# Patient Record
Sex: Female | Born: 1965 | Race: White | Hispanic: No | Marital: Married | Smoking: Former smoker
Health system: Southern US, Community
[De-identification: ages and names within clinical notes are randomized; demographics above are authoritative.]

## PROBLEM LIST (undated history)

## (undated) DIAGNOSIS — I83811 Varicose veins of right lower extremities with pain: Secondary | ICD-10-CM

## (undated) DIAGNOSIS — F319 Bipolar disorder, unspecified: Secondary | ICD-10-CM

## (undated) DIAGNOSIS — R739 Hyperglycemia, unspecified: Secondary | ICD-10-CM

## (undated) DIAGNOSIS — E669 Obesity, unspecified: Secondary | ICD-10-CM

## (undated) DIAGNOSIS — K219 Gastro-esophageal reflux disease without esophagitis: Secondary | ICD-10-CM

## (undated) DIAGNOSIS — J449 Chronic obstructive pulmonary disease, unspecified: Secondary | ICD-10-CM

## (undated) DIAGNOSIS — M1990 Unspecified osteoarthritis, unspecified site: Secondary | ICD-10-CM

## (undated) DIAGNOSIS — M419 Scoliosis, unspecified: Secondary | ICD-10-CM

## (undated) DIAGNOSIS — J45909 Unspecified asthma, uncomplicated: Secondary | ICD-10-CM

## (undated) DIAGNOSIS — M797 Fibromyalgia: Secondary | ICD-10-CM

## (undated) DIAGNOSIS — B1920 Unspecified viral hepatitis C without hepatic coma: Secondary | ICD-10-CM

## (undated) DIAGNOSIS — I10 Essential (primary) hypertension: Secondary | ICD-10-CM

## (undated) DIAGNOSIS — F419 Anxiety disorder, unspecified: Secondary | ICD-10-CM

## (undated) DIAGNOSIS — K601 Chronic anal fissure: Secondary | ICD-10-CM

## (undated) HISTORY — DX: Unspecified asthma, uncomplicated: J45.909

## (undated) HISTORY — DX: Unspecified osteoarthritis, unspecified site: M19.90

## (undated) HISTORY — DX: Obesity, unspecified: E66.9

## (undated) HISTORY — DX: Hyperglycemia, unspecified: R73.9

## (undated) HISTORY — DX: Chronic obstructive pulmonary disease, unspecified: J44.9

## (undated) HISTORY — PX: CATARACT EXTRACTION: SUR2

## (undated) HISTORY — PX: CORNEA LACERATION REPAIR: SHX355

## (undated) HISTORY — DX: Anxiety disorder, unspecified: F41.9

## (undated) HISTORY — DX: Unspecified viral hepatitis C without hepatic coma: B19.20

## (undated) HISTORY — DX: Gastro-esophageal reflux disease without esophagitis: K21.9

## (undated) HISTORY — DX: Bipolar disorder, unspecified: F31.9

## (undated) HISTORY — DX: Chronic anal fissure: K60.1

## (undated) HISTORY — PX: OTHER SURGICAL HISTORY: SHX169

## (undated) HISTORY — DX: Fibromyalgia: M79.7

## (undated) HISTORY — DX: Varicose veins of right lower extremity with pain: I83.811

## (undated) HISTORY — DX: Essential (primary) hypertension: I10

## (undated) HISTORY — DX: Scoliosis, unspecified: M41.9

---

## 1986-08-28 HISTORY — PX: TUBAL LIGATION: SHX77

## 2007-08-29 HISTORY — PX: ABDOMINAL HYSTERECTOMY: SHX81

## 2009-05-24 LAB — HM COLONOSCOPY

## 2010-08-28 HISTORY — PX: CHOLECYSTECTOMY: SHX55

## 2019-06-04 ENCOUNTER — Ambulatory Visit: Payer: Self-pay | Admitting: Family Medicine

## 2019-06-13 ENCOUNTER — Other Ambulatory Visit: Payer: Self-pay

## 2019-06-13 ENCOUNTER — Ambulatory Visit (INDEPENDENT_AMBULATORY_CARE_PROVIDER_SITE_OTHER): Payer: Medicare Other | Admitting: Family Medicine

## 2019-06-13 ENCOUNTER — Encounter: Payer: Self-pay | Admitting: Family Medicine

## 2019-06-13 VITALS — BP 126/82 | HR 70 | Temp 97.9°F | Resp 14 | Ht 65.5 in | Wt 210.0 lb

## 2019-06-13 DIAGNOSIS — J449 Chronic obstructive pulmonary disease, unspecified: Secondary | ICD-10-CM

## 2019-06-13 DIAGNOSIS — I10 Essential (primary) hypertension: Secondary | ICD-10-CM | POA: Diagnosis not present

## 2019-06-13 DIAGNOSIS — Z1211 Encounter for screening for malignant neoplasm of colon: Secondary | ICD-10-CM

## 2019-06-13 DIAGNOSIS — F419 Anxiety disorder, unspecified: Secondary | ICD-10-CM

## 2019-06-13 DIAGNOSIS — Z5181 Encounter for therapeutic drug level monitoring: Secondary | ICD-10-CM

## 2019-06-13 DIAGNOSIS — I83811 Varicose veins of right lower extremities with pain: Secondary | ICD-10-CM

## 2019-06-13 DIAGNOSIS — E785 Hyperlipidemia, unspecified: Secondary | ICD-10-CM

## 2019-06-13 DIAGNOSIS — M419 Scoliosis, unspecified: Secondary | ICD-10-CM

## 2019-06-13 DIAGNOSIS — R7303 Prediabetes: Secondary | ICD-10-CM | POA: Diagnosis not present

## 2019-06-13 DIAGNOSIS — K219 Gastro-esophageal reflux disease without esophagitis: Secondary | ICD-10-CM | POA: Insufficient documentation

## 2019-06-13 DIAGNOSIS — Z23 Encounter for immunization: Secondary | ICD-10-CM | POA: Diagnosis not present

## 2019-06-13 DIAGNOSIS — M797 Fibromyalgia: Secondary | ICD-10-CM

## 2019-06-13 DIAGNOSIS — J45909 Unspecified asthma, uncomplicated: Secondary | ICD-10-CM

## 2019-06-13 DIAGNOSIS — R600 Localized edema: Secondary | ICD-10-CM

## 2019-06-13 MED ORDER — DULOXETINE HCL 30 MG PO CPEP: 30.0000 mg | ORAL_CAPSULE | Freq: Every day | ORAL | 1 refills | Status: DC

## 2019-06-13 MED ORDER — FENOFIBRATE MICRONIZED 134 MG PO CAPS: 134.0000 mg | ORAL_CAPSULE | Freq: Every day | ORAL | 1 refills | Status: DC

## 2019-06-13 MED ORDER — BUSPIRONE HCL 15 MG PO TABS: 15.0000 mg | ORAL_TABLET | Freq: Three times a day (TID) | ORAL | 1 refills | Status: DC

## 2019-06-13 MED ORDER — NEBULIZER/TUBING/MOUTHPIECE KIT: PACK | 0 refills | Status: DC

## 2019-06-13 MED ORDER — DICLOFENAC SODIUM 1 % TD GEL: 4.0000 g | Freq: Two times a day (BID) | TRANSDERMAL | 4 refills | Status: DC

## 2019-06-13 MED ORDER — ONDANSETRON HCL 8 MG PO TABS: 8.0000 mg | ORAL_TABLET | Freq: Three times a day (TID) | ORAL | 1 refills | Status: DC | PRN

## 2019-06-13 MED ORDER — ALBUTEROL SULFATE HFA 108 (90 BASE) MCG/ACT IN AERS: 2.0000 | INHALATION_SPRAY | RESPIRATORY_TRACT | 3 refills | Status: DC | PRN

## 2019-06-13 MED ORDER — GABAPENTIN 300 MG PO CAPS: ORAL_CAPSULE | ORAL | 1 refills | Status: DC

## 2019-06-13 MED ORDER — PANTOPRAZOLE SODIUM 40 MG PO TBEC: 40.0000 mg | DELAYED_RELEASE_TABLET | Freq: Every day | ORAL | 1 refills | Status: DC

## 2019-06-13 MED ORDER — FLUTICASONE-SALMETEROL 250-50 MCG/DOSE IN AEPB: 1.0000 | INHALATION_SPRAY | Freq: Two times a day (BID) | RESPIRATORY_TRACT | 5 refills | Status: DC

## 2019-06-13 MED ORDER — IPRATROPIUM-ALBUTEROL 0.5-2.5 (3) MG/3ML IN SOLN: 3.0000 mL | Freq: Four times a day (QID) | RESPIRATORY_TRACT | 1 refills | Status: AC | PRN

## 2019-06-13 MED ORDER — LISINOPRIL 20 MG PO TABS: 20.0000 mg | ORAL_TABLET | Freq: Every day | ORAL | 3 refills | Status: DC
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
## 2019-06-13 NOTE — Patient Instructions (Signed)
Stop omeprazole and start protonix in the morning on an empty stomach - see GERD info below  I increased the dose on the inhaler, I will be working on getting you a nebulizer machine.  Would like for you to come back sooner to check this and see if we need to do some tests on your breathing, or adjust meds more.   Food Choices for Gastroesophageal Reflux Disease, Adult When you have gastroesophageal reflux disease (GERD), the foods you eat and your eating habits are very important. Choosing the right foods can help ease your discomfort. Think about working with a nutrition specialist (dietitian) to help you make good choices. What are tips for following this plan?  Meals  Choose healthy foods that are low in fat, such as fruits, vegetables, whole grains, low-fat dairy products, and lean meat, fish, and poultry.  Eat small meals often instead of 3 large meals a day. Eat your meals slowly, and in a place where you are relaxed. Avoid bending over or lying down until 2-3 hours after eating.  Avoid eating meals 2-3 hours before bed.  Avoid drinking a lot of liquid with meals.  Cook foods using methods other than frying. Bake, grill, or broil food instead.  Avoid or limit: ? Chocolate. ? Peppermint or spearmint. ? Alcohol. ? Pepper. ? Black and decaffeinated coffee. ? Black and decaffeinated tea. ? Bubbly (carbonated) soft drinks. ? Caffeinated energy drinks and soft drinks.  Limit high-fat foods such as: ? Fatty meat or fried foods. ? Whole milk, cream, butter, or ice cream. ? Nuts and nut butters. ? Pastries, donuts, and sweets made with butter or shortening.  Avoid foods that cause symptoms. These foods may be different for everyone. Common foods that cause symptoms include: ? Tomatoes. ? Oranges, lemons, and limes. ? Peppers. ? Spicy food. ? Onions and garlic. ? Vinegar. Lifestyle  Maintain a healthy weight. Ask your doctor what weight is healthy for you. If you need to  lose weight, work with your doctor to do so safely.  Exercise for at least 30 minutes for 5 or more days each week, or as told by your doctor.  Wear loose-fitting clothes.  Do not smoke. If you need help quitting, ask your doctor.  Sleep with the head of your bed higher than your feet. Use a wedge under the mattress or blocks under the bed frame to raise the head of the bed. Summary  When you have gastroesophageal reflux disease (GERD), food and lifestyle choices are very important in easing your symptoms.  Eat small meals often instead of 3 large meals a day. Eat your meals slowly, and in a place where you are relaxed.  Limit high-fat foods such as fatty meat or fried foods.  Avoid bending over or lying down until 2-3 hours after eating.  Avoid peppermint and spearmint, caffeine, alcohol, and chocolate. This information is not intended to replace advice given to you by your health care provider. Make sure you discuss any questions you have with your health care provider. Document Released: 02/13/2012 Document Revised: 12/05/2018 Document Reviewed: 09/19/2016 Elsevier Patient Education  2020 [employer].

## 2019-06-14 LAB — HEMOGLOBIN A1C
Hgb A1c MFr Bld: 6.2 % of total Hgb — ABNORMAL HIGH (ref <5.7)
Mean Plasma Glucose: 131 (calc)
eAG (mmol/L): 7.3 (calc)

## 2019-06-14 LAB — CBC WITH DIFFERENTIAL/PLATELET
Absolute Monocytes: 961 cells/uL — ABNORMAL HIGH (ref 200–950)
Basophils Absolute: 65 cells/uL (ref 0–200)
Basophils Relative: 0.6 %
Eosinophils Absolute: 194 cells/uL (ref 15–500)
Eosinophils Relative: 1.8 %
HCT: 42.2 % (ref 35.0–45.0)
Hemoglobin: 14.4 g/dL (ref 11.7–15.5)
Lymphs Abs: 3305 cells/uL (ref 850–3900)
MCH: 30.4 pg (ref 27.0–33.0)
MCHC: 34.1 g/dL (ref 32.0–36.0)
MCV: 89 fL (ref 80.0–100.0)
MPV: 12.1 fL (ref 7.5–12.5)
Monocytes Relative: 8.9 %
Neutro Abs: 6275 cells/uL (ref 1500–7800)
Neutrophils Relative %: 58.1 %
Platelets: 319 10*3/uL (ref 140–400)
RBC: 4.74 10*6/uL (ref 3.80–5.10)
RDW: 12.6 % (ref 11.0–15.0)
Total Lymphocyte: 30.6 %
WBC: 10.8 10*3/uL (ref 3.8–10.8)

## 2019-06-14 LAB — COMPLETE METABOLIC PANEL WITH GFR
AG Ratio: 1.8 (calc) (ref 1.0–2.5)
ALT: 23 U/L (ref 6–29)
AST: 19 U/L (ref 10–35)
Albumin: 4.7 g/dL (ref 3.6–5.1)
Alkaline phosphatase (APISO): 76 U/L (ref 37–153)
BUN: 20 mg/dL (ref 7–25)
CO2: 25 mmol/L (ref 20–32)
Calcium: 10.2 mg/dL (ref 8.6–10.4)
Chloride: 105 mmol/L (ref 98–110)
Creat: 0.81 mg/dL (ref 0.50–1.05)
GFR, Est African American: 95 mL/min/{1.73_m2} (ref >=60)
GFR, Est Non African American: 82 mL/min/{1.73_m2} (ref >=60)
Globulin: 2.6 g/dL (calc) (ref 1.9–3.7)
Glucose, Bld: 107 mg/dL — ABNORMAL HIGH (ref 65–99)
Potassium: 4.7 mmol/L (ref 3.5–5.3)
Sodium: 140 mmol/L (ref 135–146)
Total Bilirubin: 0.3 mg/dL (ref 0.2–1.2)
Total Protein: 7.3 g/dL (ref 6.1–8.1)

## 2019-06-14 LAB — LIPID PANEL
Cholesterol: 180 mg/dL (ref <200)
HDL: 39 mg/dL — ABNORMAL LOW (ref >=50)
LDL Cholesterol (Calc): 112 mg/dL (calc) — ABNORMAL HIGH
Non-HDL Cholesterol (Calc): 141 mg/dL (calc) — ABNORMAL HIGH (ref <130)
Total CHOL/HDL Ratio: 4.6 (calc) (ref <5.0)
Triglycerides: 177 mg/dL — ABNORMAL HIGH (ref <150)

## 2019-06-23 ENCOUNTER — Encounter: Payer: Self-pay | Admitting: Family Medicine

## 2019-06-26 ENCOUNTER — Encounter: Payer: Self-pay | Admitting: Family Medicine

## 2019-06-27 ENCOUNTER — Other Ambulatory Visit: Payer: Self-pay

## 2019-06-27 MED ORDER — BACLOFEN 10 MG PO TABS: 10.0000 mg | ORAL_TABLET | Freq: Three times a day (TID) | ORAL | 1 refills | Status: DC

## 2019-07-07 ENCOUNTER — Encounter: Payer: Self-pay | Admitting: Family Medicine

## 2019-07-09 ENCOUNTER — Encounter: Payer: Self-pay | Admitting: Family Medicine

## 2019-07-11 ENCOUNTER — Other Ambulatory Visit: Payer: Self-pay | Admitting: Family Medicine

## 2019-07-11 ENCOUNTER — Encounter: Payer: Self-pay | Admitting: Family Medicine

## 2019-07-11 MED ORDER — TRAMADOL HCL 50 MG PO TABS: 50.0000 mg | ORAL_TABLET | Freq: Three times a day (TID) | ORAL | 0 refills | Status: DC | PRN

## 2019-07-11 NOTE — Progress Notes (Signed)
meds refilled through weekend, controlled substance database reviewed, patient is on a low-dose of tramadol 50 mg 3 times a day her pain medicine prescriptions at the pharmacy are fairly consistent she has transferred care here and we did not discuss this at length that her initial appointment.  She will be coming in next week to further discuss pain management and sign a controlled substance contract.  Meds ordered this encounter  Medications  . traMADol (ULTRAM) 50 MG tablet    Sig: Take 1 tablet (50 mg total) by mouth every 8 (eight) hours as needed for up to 5 days for severe pain.    Dispense:  15 tablet    Refill:  0    For chronic pain    Order Specific Question:   Supervising Provider    Answer:   Steele Sizer [[id number]]   Delsa Grana, PA-C

## 2019-07-14 ENCOUNTER — Ambulatory Visit (INDEPENDENT_AMBULATORY_CARE_PROVIDER_SITE_OTHER): Payer: Medicare Other | Admitting: Family Medicine

## 2019-07-14 ENCOUNTER — Other Ambulatory Visit: Payer: Self-pay

## 2019-07-14 ENCOUNTER — Encounter: Payer: Self-pay | Admitting: Family Medicine

## 2019-07-14 VITALS — BP 146/84 | HR 94 | Temp 97.8°F | Resp 14 | Ht 66.0 in | Wt 215.3 lb

## 2019-07-14 DIAGNOSIS — J449 Chronic obstructive pulmonary disease, unspecified: Secondary | ICD-10-CM

## 2019-07-14 DIAGNOSIS — M419 Scoliosis, unspecified: Secondary | ICD-10-CM | POA: Diagnosis not present

## 2019-07-14 DIAGNOSIS — M5441 Lumbago with sciatica, right side: Secondary | ICD-10-CM

## 2019-07-14 DIAGNOSIS — Z0289 Encounter for other administrative examinations: Secondary | ICD-10-CM | POA: Diagnosis not present

## 2019-07-14 DIAGNOSIS — R7303 Prediabetes: Secondary | ICD-10-CM

## 2019-07-14 DIAGNOSIS — I10 Essential (primary) hypertension: Secondary | ICD-10-CM

## 2019-07-14 DIAGNOSIS — G8929 Other chronic pain: Secondary | ICD-10-CM

## 2019-07-14 DIAGNOSIS — J45909 Unspecified asthma, uncomplicated: Secondary | ICD-10-CM | POA: Diagnosis not present

## 2019-07-14 DIAGNOSIS — E785 Hyperlipidemia, unspecified: Secondary | ICD-10-CM

## 2019-07-14 DIAGNOSIS — K219 Gastro-esophageal reflux disease without esophagitis: Secondary | ICD-10-CM

## 2019-07-14 MED ORDER — TRAMADOL HCL 50 MG PO TABS: 50.0000 mg | ORAL_TABLET | Freq: Three times a day (TID) | ORAL | 0 refills | Status: DC | PRN

## 2019-07-14 NOTE — Progress Notes (Signed)
Name: Angela Herman   MRN: [id number]    DOB: 08-Apr-1966   Date:07/14/2019       Progress Note  Chief Complaint  Patient presents with  . Follow-up  . COPD  . Pain     Subjective:   Angela Herman is a 53 y.o. female, presents to clinic for routine follow up on the conditions listed above.  Here for pain management contract - she has been on tramadol 50 mg TID for several years for chronic back pain.  She sees specialists for her back, her surgical options right now offer 50/50 chance of surgery helping her pain.  She wears a back brace, she is on cymbalta 30 mg once daily, gabapentin 300 mg TID, baclofen 10 mg and diclofenac gel topically. She has done pain management in the past and was on stronger narcotic - oxycodone for 8 years and she really disliked them, 4- 10 mg oxy a day, she has a daughter with some narcotic addiction and she was very proactive about getting off oxy and finding a combo of meds that help her pain.  She has been on this combo for 15 years and it does help control her pain, allows her to function with her kids and get out and active with them.    Spine specialists Dr. Lennie Odor at [facility] in [place], [place] - has done MRI in the past, proposed surgery was a spinal fusion and she knows her back disease is from lower thoracic to lumbar and sacral spine.  Leg goes numb some times, always on the right   Switching to protonix was helpful for GERD  COPD - she is not able to get her inhaler right now - advair - she is on lower dose sample right now and did get the nebulizer machine and duoneb - she got COPD dx on her chart with PCP, she noticed when she was printing out records to bring here.   No PFT's done, and she's not sure where she got the dx from.  She is doing duoneb in the morning and evening much less nighttime waking.  Only used SABA a fewtimes in the past couple weeks.      Patient Active Problem List   Diagnosis Date Noted  . Dyslipidemia 06/13/2019  .  Varicose veins of leg with pain, right 06/13/2019  . Bilateral lower extremity edema 06/13/2019  . Scoliosis of thoracic spine 06/13/2019  . Fibromyalgia 06/13/2019  . Asthma 06/13/2019  . Chronic obstructive pulmonary disease ([place]) 06/13/2019  . Prediabetes 06/13/2019  . Essential hypertension 06/13/2019  . Gastroesophageal reflux disease 06/13/2019  . Anxiety disorder 06/13/2019    Past Surgical History:  Procedure Laterality Date  . ABDOMINAL HYSTERECTOMY  2009  . CATARACT EXTRACTION    . CHOLECYSTECTOMY  2012  . CORNEA LACERATION REPAIR    . TUBAL LIGATION  1988    Family History  Adopted: Yes  Problem Relation Age of Onset  . Bipolar disorder Daughter   . Drug abuse Daughter   . Hypertension Son     Social History   Socioeconomic History  . Marital status: Married    Spouse name: jimmie  . Number of children: 3  . Years of education: 54  . Highest education level: Bachelor's degree (e.g., BA, AB, BS)  Occupational History  . Occupation: disability  Social Needs  . Financial resource strain: Not hard at all  . Food insecurity    Worry: Never true    Inability: Never  true  . Transportation needs    Medical: No    Non-medical: No  Tobacco Use  . Smoking status: Former Smoker    Packs/day: 0.50    Years: 36.00    Pack years: 18.00    Types: Cigarettes    Quit date: 04/16/2019    Years since quitting: 0.2  . Smokeless tobacco: Never Used  Substance and Sexual Activity  . Alcohol use: Never    Frequency: Never  . Drug use: Not Currently    Comment: marijuana in the past  . Sexual activity: Not Currently    Comment: husband  Lifestyle  . Physical activity    Days per week: 7 days    Minutes per session: 30 min  . Stress: Not at all  Relationships  . Social connections    Talks on phone: More than three times a week    Gets together: More than three times a week    Attends religious service: Never    Active member of club or organization: No     Attends meetings of clubs or organizations: Never    Relationship status: Married  . Intimate partner violence    Fear of current or ex partner: No    Emotionally abused: No    Physically abused: No    Forced sexual activity: No  Other Topics Concern  . Not on file  Social History Narrative  . Not on file     Current Outpatient Medications:  .  albuterol (VENTOLIN HFA) 108 (90 Base) MCG/ACT inhaler, Inhale 2 puffs into the lungs every 4 (four) hours as needed for wheezing or shortness of breath., Disp: 18 g, Rfl: 3 .  baclofen (LIORESAL) 10 MG tablet, Take 1 tablet (10 mg total) by mouth 3 (three) times daily., Disp: 90 each, Rfl: 1 .  busPIRone (BUSPAR) 15 MG tablet, Take 1 tablet (15 mg total) by mouth 3 (three) times daily., Disp: 270 tablet, Rfl: 1 .  diclofenac sodium (VOLTAREN) 1 % GEL, Apply 4 g topically 2 (two) times daily., Disp: 100 g, Rfl: 4 .  DULoxetine (CYMBALTA) 30 MG capsule, Take 1 capsule (30 mg total) by mouth daily., Disp: 90 capsule, Rfl: 1 .  fenofibrate micronized (LOFIBRA) 134 MG capsule, Take 1 capsule (134 mg total) by mouth daily before breakfast., Disp: 90 capsule, Rfl: 1 .  Fluticasone-Salmeterol (ADVAIR) 250-50 MCG/DOSE AEPB, Inhale 1 puff into the lungs 2 (two) times daily., Disp: 60 each, Rfl: 5 .  gabapentin (NEURONTIN) 300 MG capsule, 300 mg po qam, 300 mg po q noon, and 900 mg po q bedtime, Disp: 450 capsule, Rfl: 1 .  ipratropium-albuterol (DUONEB) 0.5-2.5 (3) MG/3ML SOLN, Take 3 mLs by nebulization every 6 (six) hours as needed (for worse cough, SOB and wheeze for COPD and asthma exacerbation)., Disp: 180 mL, Rfl: 1 .  lisinopril (ZESTRIL) 20 MG tablet, Take 1 tablet (20 mg total) by mouth daily., Disp: 90 tablet, Rfl: 3 .  ondansetron (ZOFRAN) 8 MG tablet, Take 1 tablet (8 mg total) by mouth every 8 (eight) hours as needed for nausea or vomiting., Disp: 20 tablet, Rfl: 1 .  pantoprazole (PROTONIX) 40 MG tablet, Take 1 tablet (40 mg total) by mouth  daily., Disp: 90 tablet, Rfl: 1 .  Respiratory Therapy Supplies (NEBULIZER/TUBING/MOUTHPIECE) KIT, Disp one nebulizer machine, tubing set and mouthpiece kit, Disp: 1 kit, Rfl: 0 .  traMADol (ULTRAM) 50 MG tablet, Take 1 tablet (50 mg total) by mouth every 8 (eight) hours as  needed for up to 5 days for severe pain., Disp: 15 tablet, Rfl: 0  Allergies  Allergen Reactions  . Aspirin   . Alexander   . Codeine     I personally reviewed active problem list, medication list, allergies, family history, social history, health maintenance, notes from last encounter, lab results, imaging with the patient/caregiver today.  Review of Systems  Constitutional: Negative.   HENT: Negative.   Eyes: Negative.   Respiratory: Negative.   Cardiovascular: Negative.   Gastrointestinal: Negative.   Endocrine: Negative.   Genitourinary: Negative.   Musculoskeletal: Positive for back pain. Negative for gait problem.  Skin: Negative.   Allergic/Immunologic: Negative.   Neurological: Negative.   Hematological: Negative.   Psychiatric/Behavioral: Negative.   All other systems reviewed and are negative.    Objective:    Vitals:   07/14/19 1312  BP: (!) 146/84  Pulse: 94  Resp: 14  Temp: 97.8 F (36.6 C)  SpO2: 98%  Weight: 215 lb 4.8 oz (97.7 kg)  Height: 5' 6"  (1.676 m)    Body mass index is 34.75 kg/m.  Physical Exam Vitals signs and nursing note reviewed.  Constitutional:      General: She is not in acute distress.    Appearance: Normal appearance. She is well-developed. She is obese. She is not ill-appearing, toxic-appearing or diaphoretic.     Interventions: Face mask in place.  HENT:     Head: Normocephalic and atraumatic.     Right Ear: External ear normal.     Left Ear: External ear normal.  Eyes:     General: Lids are normal. No scleral icterus.       Right eye: No discharge.        Left eye: No discharge.     Conjunctiva/sclera: Conjunctivae normal.  Neck:      Musculoskeletal: Normal range of motion and neck supple.     Trachea: Phonation normal. No tracheal deviation.  Cardiovascular:     Rate and Rhythm: Normal rate and regular rhythm.     Pulses: Normal pulses.          Radial pulses are 2+ on the right side and 2+ on the left side.       Posterior tibial pulses are 2+ on the right side and 2+ on the left side.     Heart sounds: Normal heart sounds.  Pulmonary:     Effort: Pulmonary effort is normal. No respiratory distress.     Breath sounds: Normal breath sounds.  Abdominal:     General: There is no distension.  Musculoskeletal:        General: No swelling.     Comments: Wearing lumbar support back brace  Skin:    General: Skin is warm and dry.     Capillary Refill: Capillary refill takes less than 2 seconds.     Coloration: Skin is not jaundiced or pale.     Findings: No bruising or rash.  Neurological:     Mental Status: She is alert and oriented to person, place, and time.     Motor: No weakness or abnormal muscle tone.     Coordination: Coordination normal.     Gait: Gait normal.  Psychiatric:        Mood and Affect: Mood normal.        Speech: Speech normal.        Behavior: Behavior normal.      Recent Results (from the past 2160 hour(s))  CBC with Differential/Platelet  Status: Abnormal   Collection Time: 06/13/19 12:00 AM  Result Value Ref Range   WBC 10.8 3.8 - 10.8 Thousand/uL   RBC 4.74 3.80 - 5.10 Million/uL   Hemoglobin 14.4 11.7 - 15.5 g/dL   HCT 42.2 35.0 - 45.0 %   MCV 89.0 80.0 - 100.0 fL   MCH 30.4 27.0 - 33.0 pg   MCHC 34.1 32.0 - 36.0 g/dL   RDW 12.6 11.0 - 15.0 %   Platelets 319 140 - 400 Thousand/uL   MPV 12.1 7.5 - 12.5 fL   Neutro Abs 6,275 1,500 - 7,800 cells/uL   Lymphs Abs 3,305 850 - 3,900 cells/uL   Absolute Monocytes 961 (H) 200 - 950 cells/uL   Eosinophils Absolute 194 15 - 500 cells/uL   Basophils Absolute 65 0 - 200 cells/uL   Neutrophils Relative % 58.1 %   Total Lymphocyte 30.6  %   Monocytes Relative 8.9 %   Eosinophils Relative 1.8 %   Basophils Relative 0.6 %  COMPLETE METABOLIC PANEL WITH GFR     Status: Abnormal   Collection Time: 06/13/19 12:00 AM  Result Value Ref Range   Glucose, Bld 107 (H) 65 - 99 mg/dL    Comment: .            Fasting reference interval . For someone without known diabetes, a glucose value between 100 and 125 mg/dL is consistent with prediabetes and should be confirmed with a follow-up test. .    BUN 20 7 - 25 mg/dL   Creat 0.81 0.50 - 1.05 mg/dL    Comment: For patients >37 years of age, the reference limit for Creatinine is approximately 13% higher for people identified as African-American. .    GFR, Est Non African American 82 > OR = 60 mL/min/1.51m   GFR, Est African American 95 > OR = 60 mL/min/1.716m  BUN/Creatinine Ratio NOT APPLICABLE 6 - 22 (calc)   Sodium 140 135 - 146 mmol/L   Potassium 4.7 3.5 - 5.3 mmol/L   Chloride 105 98 - 110 mmol/L   CO2 25 20 - 32 mmol/L   Calcium 10.2 8.6 - 10.4 mg/dL   Total Protein 7.3 6.1 - 8.1 g/dL   Albumin 4.7 3.6 - 5.1 g/dL   Globulin 2.6 1.9 - 3.7 g/dL (calc)   AG Ratio 1.8 1.0 - 2.5 (calc)   Total Bilirubin 0.3 0.2 - 1.2 mg/dL   Alkaline phosphatase (APISO) 76 37 - 153 U/L   AST 19 10 - 35 U/L   ALT 23 6 - 29 U/L  Lipid panel     Status: Abnormal   Collection Time: 06/13/19 12:00 AM  Result Value Ref Range   Cholesterol 180 <200 mg/dL   HDL 39 (L) > OR = 50 mg/dL   Triglycerides 177 (H) <150 mg/dL   LDL Cholesterol (Calc) 112 (H) mg/dL (calc)    Comment: Reference range: <100 . Desirable range <100 mg/dL for primary prevention;   <70 mg/dL for patients with CHD or diabetic patients  with > or = 2 CHD risk factors. . Marland KitchenDL-C is now calculated using the Martin-Hopkins  calculation, which is a validated novel method providing  better accuracy than the Friedewald equation in the  estimation of LDL-C.  MaCresenciano Genret al. JAAnnamaria [id number];009([id number]   (http://education.[facility].com/faq/FAQ164)    Total CHOL/HDL Ratio 4.6 <5.0 (calc)   Non-HDL Cholesterol (Calc) 141 (H) <130 mg/dL (calc)    Comment: For patients with diabetes plus 1 major ASCVD  risk  factor, treating to a non-HDL-C goal of <100 mg/dL  (LDL-C of <70 mg/dL) is considered a therapeutic  option.   Hemoglobin A1c     Status: Abnormal   Collection Time: 06/13/19 12:00 AM  Result Value Ref Range   Hgb A1c MFr Bld 6.2 (H) <5.7 % of total Hgb    Comment: For someone without known diabetes, a hemoglobin  A1c value between 5.7% and 6.4% is consistent with prediabetes and should be confirmed with a  follow-up test. . For someone with known diabetes, a value <7% indicates that their diabetes is well controlled. A1c targets should be individualized based on duration of diabetes, age, comorbid conditions, and other considerations. . This assay result is consistent with an increased risk of diabetes. . Currently, no consensus exists regarding use of hemoglobin A1c for diagnosis of diabetes for children. .    Mean Plasma Glucose 131 (calc)   eAG (mmol/L) 7.3 (calc)     PHQ2/9: Depression screen [facility] 2/9 07/14/2019 06/13/2019  Decreased Interest 0 0  Down, Depressed, Hopeless 1 0  PHQ - 2 Score 1 0  Altered sleeping 0 0  Tired, decreased energy 0 0  Change in appetite 0 0  Feeling bad or failure about yourself  0 0  Trouble concentrating 0 0  Moving slowly or fidgety/restless 0 0  Suicidal thoughts 0 0  PHQ-9 Score 1 0  Difficult doing work/chores Not difficult at all Not difficult at all    phq 9 is negative, less than 4, reviewed today, on treatment  Fall Risk: Fall Risk  07/14/2019 06/13/2019  Falls in the past year? 0 0  Number falls in past yr: 0 0  Injury with Fall? 0 0    The 10-year ASCVD risk score Mikey Bussing DC Jr., et al., 2013) is: 4%   Values used to calculate the score:     Age: 89 years     Sex: Female     Is Non-Hispanic African  American: No     Diabetic: No     Tobacco smoker: No     Systolic Blood Pressure: 347 mmHg     Is BP treated: Yes     HDL Cholesterol: 39 mg/dL     Total Cholesterol: 180 mg/dL   Functional Status Survey: Is the patient deaf or have difficulty hearing?: No Does the patient have difficulty seeing, even when wearing glasses/contacts?: No Does the patient have difficulty concentrating, remembering, or making decisions?: No Does the patient have difficulty walking or climbing stairs?: No Does the patient have difficulty dressing or bathing?: No Does the patient have difficulty doing errands alone such as visiting a doctor's office or shopping?: No    Assessment & Plan:     ICD-10-CM   1. Chronic obstructive pulmonary disease, unspecified COPD type ([place])  J44.9 Pulmonary Function Test [facility] Only   unclear when she got dx of COPD, ordering PFT's and will need COVID test - pt agrees, duoneb BID is helping her breathing a lot, currently well controlled  2. Asthma, unspecified asthma severity, unspecified whether complicated, unspecified whether persistent  J45.909 Pulmonary Function Test [facility] Only   see above, on lower dose maintenance inhaler which is okay currently with DuoNebs and infrequent use of Saba, PFTs pending  3. Pain management contract agreement  Z02.89 traMADol (ULTRAM) 50 MG tablet   Reviewed pain management contract, she has signed today, feel very comfortable with her current dosing and other meds which control pain  4.  Scoliosis of thoracic spine, unspecified scoliosis type  M41.9 traMADol (ULTRAM) 50 MG tablet   sx and pain currently well controlled  5. Chronic bilateral low back pain with right-sided sciatica  M54.41 traMADol (ULTRAM) 50 MG tablet   G89.29    need record from spinal specialist - no signs of current radiculopathy, pain and function well controlled, refill on tramadol today.   6. Prediabetes  R73.03    Discussed and reviewed her lab work and dietary  changes, she is gained 15 pounds and not exercising which she is trying to lose weight and start exercising agai  7. Gastroesophageal reflux disease, unspecified whether esophagitis present  K21.9    Symptoms have significantly improved with switching to Protonix  8. Essential hypertension  I10    Blood pressure slightly elevated but she was a little anxious coming to discuss pain medication which she had been frustrated about since last week  9. Dyslipidemia  E78.5    Reviewed her cholesterol panel, lifestyle changes -she is only on fenofibrate consider switching to Lipitor or Crestor   Reviewed the patient's labs extensively today, her recent med changes, her diagnoses and she did make an appointment for her dizziness but that resolved so we did not discuss that today.    Return in about 6 months (around 01/11/2020) for Routine follow-up.   Delsa Grana, PA-C 07/14/19 1:21 PM

## 2019-07-14 NOTE — Patient Instructions (Addendum)
Your labs: Recent Results (from the past 2160 hour(s))  CBC with Differential/Platelet     Status: Abnormal   Collection Time: 06/13/19 12:00 AM  Result Value Ref Range   WBC 10.8 3.8 - 10.8 Thousand/uL   RBC 4.74 3.80 - 5.10 Million/uL   Hemoglobin 14.4 11.7 - 15.5 g/dL   HCT 42.2 35.0 - 45.0 %   MCV 89.0 80.0 - 100.0 fL   MCH 30.4 27.0 - 33.0 pg   MCHC 34.1 32.0 - 36.0 g/dL   RDW 12.6 11.0 - 15.0 %   Platelets 319 140 - 400 Thousand/uL   MPV 12.1 7.5 - 12.5 fL   Neutro Abs 6,275 1,500 - 7,800 cells/uL   Lymphs Abs 3,305 850 - 3,900 cells/uL   Absolute Monocytes 961 (H) 200 - 950 cells/uL   Eosinophils Absolute 194 15 - 500 cells/uL   Basophils Absolute 65 0 - 200 cells/uL   Neutrophils Relative % 58.1 %   Total Lymphocyte 30.6 %   Monocytes Relative 8.9 %   Eosinophils Relative 1.8 %   Basophils Relative 0.6 %  COMPLETE METABOLIC PANEL WITH GFR     Status: Abnormal   Collection Time: 06/13/19 12:00 AM  Result Value Ref Range   Glucose, Bld 107 (H) 65 - 99 mg/dL    Comment: .            Fasting reference interval . For someone without known diabetes, a glucose value between 100 and 125 mg/dL is consistent with prediabetes and should be confirmed with a follow-up test. .    BUN 20 7 - 25 mg/dL   Creat 0.81 0.50 - 1.05 mg/dL    Comment: For patients >21 years of age, the reference limit for Creatinine is approximately 13% higher for people identified as African-American. .    GFR, Est Non African American 82 > OR = 60 mL/min/1.39m2   GFR, Est African American 95 > OR = 60 mL/min/1.1m2   BUN/Creatinine Ratio NOT APPLICABLE 6 - 22 (calc)   Sodium 140 135 - 146 mmol/L   Potassium 4.7 3.5 - 5.3 mmol/L   Chloride 105 98 - 110 mmol/L   CO2 25 20 - 32 mmol/L   Calcium 10.2 8.6 - 10.4 mg/dL   Total Protein 7.3 6.1 - 8.1 g/dL   Albumin 4.7 3.6 - 5.1 g/dL   Globulin 2.6 1.9 - 3.7 g/dL (calc)   AG Ratio 1.8 1.0 - 2.5 (calc)   Total Bilirubin 0.3 0.2 - 1.2 mg/dL   Alkaline phosphatase (APISO) 76 37 - 153 U/L   AST 19 10 - 35 U/L   ALT 23 6 - 29 U/L  Lipid panel     Status: Abnormal   Collection Time: 06/13/19 12:00 AM  Result Value Ref Range   Cholesterol 180 <200 mg/dL   HDL 39 (L) > OR = 50 mg/dL   Triglycerides 177 (H) <150 mg/dL   LDL Cholesterol (Calc) 112 (H) mg/dL (calc)    Comment: Reference range: <100 . Desirable range <100 mg/dL for primary prevention;   <70 mg/dL for patients with CHD or diabetic patients  with > or = 2 CHD risk factors. Marland Kitchen LDL-C is now calculated using the Martin-Hopkins  calculation, which is a validated novel method providing  better accuracy than the Friedewald equation in the  estimation of LDL-C.  Cresenciano Genre et al. Annamaria Helling. [id number];433(29): 2061-2068  (http://education.[facility].com/faq/FAQ164)    Total CHOL/HDL Ratio 4.6 <5.0 (calc)   Non-HDL Cholesterol (Calc) 141 (H) <  130 mg/dL (calc)    Comment: For patients with diabetes plus 1 major ASCVD risk  factor, treating to a non-HDL-C goal of <100 mg/dL  (LDL-C of <72 mg/dL) is considered a therapeutic  option.   Hemoglobin A1c     Status: Abnormal   Collection Time: 06/13/19 12:00 AM  Result Value Ref Range   Hgb A1c MFr Bld 6.2 (H) <5.7 % of total Hgb    Comment: For someone without known diabetes, a hemoglobin  A1c value between 5.7% and 6.4% is consistent with prediabetes and should be confirmed with a  follow-up test. . For someone with known diabetes, a value <7% indicates that their diabetes is well controlled. A1c targets should be individualized based on duration of diabetes, age, comorbid conditions, and other considerations. . This assay result is consistent with an increased risk of diabetes. . Currently, no consensus exists regarding use of hemoglobin A1c for diagnosis of diabetes for children. .    Mean Plasma Glucose 131 (calc)   eAG (mmol/L) 7.3 (calc)

## 2019-08-06 ENCOUNTER — Other Ambulatory Visit: Payer: Self-pay

## 2019-08-06 MED ORDER — BACLOFEN 10 MG PO TABS: 10.0000 mg | ORAL_TABLET | Freq: Three times a day (TID) | ORAL | 3 refills | Status: DC

## 2019-08-18 ENCOUNTER — Encounter: Payer: Self-pay | Admitting: Family Medicine

## 2019-08-19 ENCOUNTER — Other Ambulatory Visit: Payer: Self-pay | Admitting: Family Medicine

## 2019-08-19 DIAGNOSIS — Z0289 Encounter for other administrative examinations: Secondary | ICD-10-CM

## 2019-08-19 DIAGNOSIS — G8929 Other chronic pain: Secondary | ICD-10-CM

## 2019-08-19 DIAGNOSIS — M5441 Lumbago with sciatica, right side: Secondary | ICD-10-CM

## 2019-08-19 DIAGNOSIS — M419 Scoliosis, unspecified: Secondary | ICD-10-CM

## 2019-08-19 MED ORDER — TRAMADOL HCL 50 MG PO TABS: 50.0000 mg | ORAL_TABLET | Freq: Three times a day (TID) | ORAL | 0 refills | Status: DC | PRN

## 2019-08-19 NOTE — Progress Notes (Signed)
Med refill on tramadol which manages chronic back pain in addition to cymbalta, voltaren gel, baclofen and gabapentin. Pt compliant.   PDMP reviewed, no concerns or red flags.  Refill prescribed

## 2019-08-20 ENCOUNTER — Other Ambulatory Visit: Payer: Self-pay | Admitting: Emergency Medicine

## 2019-08-20 DIAGNOSIS — Z0289 Encounter for other administrative examinations: Secondary | ICD-10-CM

## 2019-08-20 DIAGNOSIS — M419 Scoliosis, unspecified: Secondary | ICD-10-CM

## 2019-08-20 DIAGNOSIS — G8929 Other chronic pain: Secondary | ICD-10-CM

## 2019-08-20 MED ORDER — ONDANSETRON HCL 8 MG PO TABS: 8.0000 mg | ORAL_TABLET | Freq: Three times a day (TID) | ORAL | 1 refills | Status: DC | PRN

## 2019-08-20 MED ORDER — TRAMADOL HCL 50 MG PO TABS: 50.0000 mg | ORAL_TABLET | Freq: Three times a day (TID) | ORAL | 0 refills | Status: DC | PRN

## 2019-09-05 ENCOUNTER — Encounter: Payer: Self-pay | Admitting: Family Medicine

## 2019-09-17 ENCOUNTER — Encounter: Payer: Self-pay | Admitting: Family Medicine

## 2019-09-18 ENCOUNTER — Encounter: Payer: Self-pay | Admitting: Family Medicine

## 2019-09-18 DIAGNOSIS — G8929 Other chronic pain: Secondary | ICD-10-CM

## 2019-09-18 DIAGNOSIS — Z0289 Encounter for other administrative examinations: Secondary | ICD-10-CM

## 2019-09-18 DIAGNOSIS — M419 Scoliosis, unspecified: Secondary | ICD-10-CM

## 2019-09-18 MED ORDER — TRAMADOL HCL 50 MG PO TABS: 50.0000 mg | ORAL_TABLET | Freq: Three times a day (TID) | ORAL | 0 refills | Status: DC | PRN

## 2019-09-19 ENCOUNTER — Other Ambulatory Visit: Payer: Self-pay | Admitting: Emergency Medicine

## 2019-09-25 ENCOUNTER — Ambulatory Visit (INDEPENDENT_AMBULATORY_CARE_PROVIDER_SITE_OTHER): Payer: Medicare Other

## 2019-09-25 ENCOUNTER — Other Ambulatory Visit: Payer: Self-pay

## 2019-09-25 VITALS — Ht 66.0 in | Wt 205.0 lb

## 2019-09-25 DIAGNOSIS — Z0000 Encounter for general adult medical examination without abnormal findings: Secondary | ICD-10-CM

## 2019-09-25 DIAGNOSIS — Z1211 Encounter for screening for malignant neoplasm of colon: Secondary | ICD-10-CM

## 2019-09-25 DIAGNOSIS — Z0100 Encounter for examination of eyes and vision without abnormal findings: Secondary | ICD-10-CM | POA: Diagnosis not present

## 2019-09-25 DIAGNOSIS — Z1231 Encounter for screening mammogram for malignant neoplasm of breast: Secondary | ICD-10-CM | POA: Diagnosis not present

## 2019-09-25 NOTE — Progress Notes (Signed)
Subjective:   Angela Herman is a 54 y.o. female who presents for an Initial Medicare Annual Wellness Visit.  Virtual Visit via Telephone Note  I connected with Angela Herman on 09/25/19 at  3:30 PM EST by telephone and verified that I am speaking with the correct person using two identifiers.  Medicare Annual Wellness visit completed telephonically due to Covid-19 pandemic.   Location: Patient: home Provider: office   I discussed the limitations, risks, security and privacy concerns of performing an evaluation and management service by telephone and the availability of in person appointments. The patient expressed understanding and agreed to proceed.  Some vital signs may be absent or patient reported.   Clemetine Marker, LPN    Review of Systems      Cardiac Risk Factors include: advanced age (>42mn, >>83women);obesity (BMI >30kg/m2);hypertension     Objective:    Today's Vitals   09/25/19 1511 09/25/19 1512  Weight: 205 lb (93 kg)   Height: 5' 6" (1.676 m)   PainSc:  2    Body mass index is 33.09 kg/m.  Advanced Directives 09/25/2019  Does Patient Have a Medical Advance Directive? Yes  Type of AParamedicof AVevayLiving will  Copy of HCrescentin Chart? No - copy requested    Current Medications (verified) Outpatient Encounter Medications as of 09/25/2019  Medication Sig  . albuterol (VENTOLIN HFA) 108 (90 Base) MCG/ACT inhaler Inhale 2 puffs into the lungs every 4 (four) hours as needed for wheezing or shortness of breath.  . baclofen (LIORESAL) 10 MG tablet Take 1 tablet (10 mg total) by mouth 3 (three) times daily.  . busPIRone (BUSPAR) 15 MG tablet Take 1 tablet (15 mg total) by mouth 3 (three) times daily.  . diclofenac sodium (VOLTAREN) 1 % GEL Apply 4 g topically 2 (two) times daily.  . DULoxetine (CYMBALTA) 30 MG capsule Take 1 capsule (30 mg total) by mouth daily.  . fenofibrate micronized (LOFIBRA) 134 MG  capsule Take 1 capsule (134 mg total) by mouth daily before breakfast.  . Fluticasone-Salmeterol (ADVAIR) 250-50 MCG/DOSE AEPB Inhale 1 puff into the lungs 2 (two) times daily.  .Marland Kitchengabapentin (NEURONTIN) 300 MG capsule 300 mg po qam, 300 mg po q noon, and 900 mg po q bedtime  . ipratropium-albuterol (DUONEB) 0.5-2.5 (3) MG/3ML SOLN Take 3 mLs by nebulization every 6 (six) hours as needed (for worse cough, SOB and wheeze for COPD and asthma exacerbation).  .Marland Kitchenlisinopril (ZESTRIL) 20 MG tablet Take 1 tablet (20 mg total) by mouth daily.  . ondansetron (ZOFRAN) 8 MG tablet Take 1 tablet (8 mg total) by mouth every 8 (eight) hours as needed for nausea or vomiting.  . pantoprazole (PROTONIX) 40 MG tablet Take 1 tablet (40 mg total) by mouth daily.  .Marland KitchenRespiratory Therapy Supplies (NEBULIZER/TUBING/MOUTHPIECE) KIT Disp one nebulizer machine, tubing set and mouthpiece kit  . traMADol (ULTRAM) 50 MG tablet Take 1 tablet (50 mg total) by mouth every 8 (eight) hours as needed for severe pain (for chronic back pain).   No facility-administered encounter medications on file as of 09/25/2019.    Allergies (verified) Aspirin, Cherry, and Codeine   History: Past Medical History:  Diagnosis Date  . Anxiety   . Asthma   . Bipolar 1 disorder ([place]   . Chronic anal fissure   . COPD (chronic obstructive pulmonary disease) ([place]   . Fibromyalgia   . GERD (gastroesophageal reflux disease)   . Hyperglycemia   .  Hypertension   . Obesity   . Osteoarthritis   . Scoliosis   . Varicose veins of leg with pain, right   . Viral hepatitis C    Past Surgical History:  Procedure Laterality Date  . ABDOMINAL HYSTERECTOMY  2009  . CATARACT EXTRACTION    . CHOLECYSTECTOMY  2012  . CORNEA LACERATION REPAIR    . TUBAL LIGATION  1988   Family History  Adopted: Yes  Problem Relation Age of Onset  . Bipolar disorder Daughter   . Drug abuse Daughter   . Hypertension Son    Social History   Socioeconomic History    . Marital status: Married    Spouse name: jimmie  . Number of children: 3  . Years of education: 43  . Highest education level: Bachelor's degree (e.g., BA, AB, BS)  Occupational History  . Occupation: disability  Tobacco Use  . Smoking status: Former Smoker    Packs/day: 0.50    Years: 36.00    Pack years: 18.00    Types: Cigarettes    Quit date: 04/16/2019    Years since quitting: 0.4  . Smokeless tobacco: Never Used  Substance and Sexual Activity  . Alcohol use: Never  . Drug use: Not Currently    Comment: marijuana in the past  . Sexual activity: Not Currently    Comment: husband  Other Topics Concern  . Not on file  Social History Narrative  . Not on file   Social Determinants of Health   Financial Resource Strain: Low Risk   . Difficulty of Paying Living Expenses: Not hard at all  Food Insecurity: No Food Insecurity  . Worried About Charity fundraiser in the Last Year: Never true  . Ran Out of Food in the Last Year: Never true  Transportation Needs: No Transportation Needs  . Lack of Transportation (Medical): No  . Lack of Transportation (Non-Medical): No  Physical Activity: Sufficiently Active  . Days of Exercise per Week: 7 days  . Minutes of Exercise per Session: 30 min  Stress: No Stress Concern Present  . Feeling of Stress : Only a little  Social Connections: Somewhat Isolated  . Frequency of Communication with Friends and Family: More than three times a week  . Frequency of Social Gatherings with Friends and Family: More than three times a week  . Attends Religious Services: Never  . Active Member of Clubs or Organizations: No  . Attends Archivist Meetings: Never  . Marital Status: Married    Tobacco Counseling Counseling given: Not Answered   Clinical Intake:  Pre-visit preparation completed: Yes  Pain : 0-10 Pain Score: 2  Pain Type: Chronic pain Pain Onset: More than a month ago Pain Frequency: Constant     BMI - recorded:  33.09 Nutritional Status: BMI > 30  Obese Nutritional Risks: None Diabetes: No CBG done?: No Did pt. bring in CBG monitor from home?: No  How often do you need to have someone help you when you read instructions, pamphlets, or other written materials from your doctor or pharmacy?: 1 - Never  Interpreter Needed?: No  Information entered by :: Clemetine Marker LPN   Activities of Daily Living In your present state of health, do you have any difficulty performing the following activities: 09/25/2019 07/14/2019  Hearing? Y N  Comment declines hearing aids -  Vision? Y N  Difficulty concentrating or making decisions? N N  Walking or climbing stairs? N N  Dressing or bathing?  N N  Doing errands, shopping? N N  Preparing Food and eating ? N -  Using the Toilet? N -  In the past six months, have you accidently leaked urine? N -  Do you have problems with loss of bowel control? N -  Managing your Medications? N -  Managing your Finances? N -  Housekeeping or managing your Housekeeping? N -     Immunizations and Health Maintenance Immunization History  Administered Date(s) Administered  . Influenza,inj,Quad PF,6+ Mos 06/13/2019   Health Maintenance Due  Topic Date Due  . HIV Screening  12/06/1979  . PAP SMEAR-Modifier  12/05/1985  . MAMMOGRAM  12/06/2014  . COLONOSCOPY  05/25/2019    Patient Care Team: Delsa Grana, PA-C as PCP - General (Family Medicine)  Indicate any recent Medical Services you may have received from other than [facility] providers in the past year (date may be approximate).     Assessment:   This is a routine wellness examination for [facility].  Hearing/Vision screen  Hearing Screening   125Hz 250Hz 500Hz 1000Hz 2000Hz 3000Hz 4000Hz 6000Hz 8000Hz  Right ear:           Left ear:           Comments: Pt c/o mild hearing difficulty due to hx of trauma  Vision Screening Comments: Pt past due for eye exam, needs to establish care with new provider, new to the area.  Opthalmology referral done today.  Dietary issues and exercise activities discussed: Current Exercise Habits: Home exercise routine, Type of exercise: walking, Time (Minutes): 30, Frequency (Times/Week): 7, Weekly Exercise (Minutes/Week): 210, Intensity: Mild, Exercise limited by: respiratory conditions(s)  Goals    . Weight (lb) < 200 lb (90.7 kg)     Pt states she would like to lose weight over the next year with diet and exercise      Depression Screen PHQ 2/9 Scores 09/25/2019 07/14/2019 06/13/2019  PHQ - 2 Score 2 1 0  PHQ- 9 Score 9 1 0    Fall Risk Fall Risk  09/25/2019 07/14/2019 06/13/2019  Falls in the past year? 0 0 0  Number falls in past yr: 0 0 0  Injury with Fall? 0 0 0  Risk for fall due to : No Fall Risks - -  Follow up Falls prevention discussed - -    FALL RISK PREVENTION PERTAINING TO THE HOME:  Any stairs in or around the home? Yes  If so, do they handrails? Yes   Home free of loose throw rugs in walkways, pet beds, electrical cords, etc? Yes  Adequate lighting in your home to reduce risk of falls? Yes   ASSISTIVE DEVICES UTILIZED TO PREVENT FALLS:  Life alert? No  Use of a cane, walker or w/c? No  Grab bars in the bathroom? No  Shower chair or bench in shower? No  Elevated toilet seat or a handicapped toilet? No   DME ORDERS:  DME order needed?  No   TIMED UP AND GO:  Was the test performed? No . Telephonic visit.   Education: Fall risk prevention has been discussed.  Intervention(s) required? No    Cognitive Function: 6CIT deferred for 2021 AWV. Pt has no memory issues.         Screening Tests Health Maintenance  Topic Date Due  . HIV Screening  12/06/1979  . PAP SMEAR-Modifier  12/05/1985  . MAMMOGRAM  12/06/2014  . COLONOSCOPY  05/25/2019  . TETANUS/TDAP  08/28/2020 (Originally 12/06/1983)  .  INFLUENZA VACCINE  Completed    Qualifies for Shingles Vaccine? Yes . Due for Shingrix. Education has been provided regarding the  importance of this vaccine. Pt has been advised to call insurance company to determine out of pocket expense. Advised may also receive vaccine at local pharmacy or Health Dept. Verbalized acceptance and understanding.  Tdap: Although this vaccine is not a covered service during a Wellness Exam, does the patient still wish to receive this vaccine today?  No .  Education has been provided regarding the importance of this vaccine. Advised may receive this vaccine at local pharmacy or Health Dept. Aware to provide a copy of the vaccination record if obtained from local pharmacy or Health Dept. Verbalized acceptance and understanding.  Flu Vaccine: Up to date  Pneumococcal Vaccine: Due for Pneumococcal vaccine at age 22.   Cancer Screenings:  Colorectal Screening: Completed 05/24/09. Repeat every 10 years;  Referral to GI placed today. Pt aware the office will call re: appt.  Mammogram:  Ordered today. Pt provided with contact information and advised to call to schedule appt.   Bone Density: due at age 36  Lung Cancer Screening: (Low Dose CT Chest recommended if Age 58-80 years, 30 pack-year currently smoking OR have quit w/in 15years.) does not qualify.  Pt will qualify once she turns 54 and is interested in this screening. Pt aware of service and will need to be referred once qualification met.    Additional Screening:  Hepatitis C Screening: does not qualify  Vision Screening: Recommended annual ophthalmology exams for early detection of glaucoma and other disorders of the eye. Is the patient up to date with their annual eye exam?  No  Who is the provider or what is the name of the office in which the pt attends annual eye exams? Not established  If pt is not established with a provider, would they like to be referred to a provider to establish care? Yes . Ophthalmology referral has been placed. Pt aware the office will call re: appt.  Dental Screening: Recommended annual dental exams for  proper oral hygiene  Community Resource Referral:  CRR required this visit?  No      Plan:     I have personally reviewed and addressed the Medicare Annual Wellness questionnaire and have noted the following in the patient's chart:  A. Medical and social history B. Use of alcohol, tobacco or illicit drugs  C. Current medications and supplements D. Functional ability and status E.  Nutritional status F.  Physical activity G. Advance directives H. List of other physicians I.  Hospitalizations, surgeries, and ER visits in previous 12 months J.  [place] such as hearing and vision if needed, cognitive and depression L. Referrals and appointments   In addition, I have reviewed and discussed with patient certain preventive protocols, quality metrics, and best practice recommendations. A written personalized care plan for preventive services as well as general preventive health recommendations were provided to patient.   Signed,  Clemetine Marker, LPN Nurse Health Advisor   Nurse Notes: Pt states her husband recently found out he has lung cancer and now on oxygen. He also has emphysema. Pt reports feeling more anxious and difficulty sleeping since his diagnosis but feels she is handling it okay. Pt declined offer to refer to [facility] teams for community resources and support groups/counseling but will notify us if she would like more information.

## 2019-09-25 NOTE — Patient Instructions (Signed)
Angela Herman , Thank you for taking time to come for your Medicare Wellness Visit. I appreciate your ongoing commitment to your health goals. Please review the following plan we discussed and let me know if I can assist you in the future.   Screening recommendations/referrals: Colonoscopy: Referral sent to GI today for screening colonoscopy.  Mammogram: Please call [phone] to schedule your mammogram.  Bone Density: due age 54 Recommended yearly ophthalmology/optometry visit for glaucoma screening and checkup Recommended yearly dental visit for hygiene and checkup  Vaccinations: Influenza vaccine: done 06/13/19 Pneumococcal vaccine: due age 65 Tdap vaccine: due Shingles vaccine: Shingrix discussed. Please contact your pharmacy for coverage information.   Advanced directives: Please bring a copy of your health care power of attorney and living will to the office at your convenience.  Conditions/risks identified: Recommend healthy eating and physical activity for desired weight loss.   Next appointment: Please follow up in one year for your Medicare Annual Wellness visit.    Preventive Care 40-64 Years, Female Preventive care refers to lifestyle choices and visits with your health care provider that can promote health and wellness. What does preventive care include?  A yearly physical exam. This is also called an annual well check.  Dental exams once or twice a year.  Routine eye exams. Ask your health care provider how often you should have your eyes checked.  Personal lifestyle choices, including:  Daily care of your teeth and gums.  Regular physical activity.  Eating a healthy diet.  Avoiding tobacco and drug use.  Limiting alcohol use.  Practicing safe sex.  Taking low-dose aspirin daily starting at age 66.  Taking vitamin and mineral supplements as recommended by your health care provider. What happens during an annual well check? The services and screenings  done by your health care provider during your annual well check will depend on your age, overall health, lifestyle risk factors, and family history of disease. Counseling  Your health care provider may ask you questions about your:  Alcohol use.  Tobacco use.  Drug use.  Emotional well-being.  Home and relationship well-being.  Sexual activity.  Eating habits.  Work and work Statistician.  Method of birth control.  Menstrual cycle.  Pregnancy history. Screening  You may have the following tests or measurements:  Height, weight, and BMI.  Blood pressure.  Lipid and cholesterol levels. These may be checked every 5 years, or more frequently if you are over 43 years old.  Skin check.  Lung cancer screening. You may have this screening every year starting at age 84 if you have a 30-pack-year history of smoking and currently smoke or have quit within the past 15 years.  Fecal occult blood test (FOBT) of the stool. You may have this test every year starting at age 69.  Flexible sigmoidoscopy or colonoscopy. You may have a sigmoidoscopy every 5 years or a colonoscopy every 10 years starting at age 51.  Hepatitis C blood test.  Hepatitis B blood test.  Sexually transmitted disease (STD) testing.  Diabetes screening. This is done by checking your blood sugar (glucose) after you have not eaten for a while (fasting). You may have this done every 1-3 years.  Mammogram. This may be done every 1-2 years. Talk to your health care provider about when you should start having regular mammograms. This may depend on whether you have a family history of breast cancer.  BRCA-related cancer screening. This may be done if you have a family history of breast,  ovarian, tubal, or peritoneal cancers.  Pelvic exam and Pap test. This may be done every 3 years starting at age 60. Starting at age 60, this may be done every 5 years if you have a Pap test in combination with an HPV test.  Bone  density scan. This is done to screen for osteoporosis. You may have this scan if you are at high risk for osteoporosis. Discuss your test results, treatment options, and if necessary, the need for more tests with your health care provider. Vaccines  Your health care provider may recommend certain vaccines, such as:  Influenza vaccine. This is recommended every year.  Tetanus, diphtheria, and acellular pertussis (Tdap, Td) vaccine. You may need a Td booster every 10 years.  Zoster vaccine. You may need this after age 43.  Pneumococcal 13-valent conjugate (PCV13) vaccine. You may need this if you have certain conditions and were not previously vaccinated.  Pneumococcal polysaccharide (PPSV23) vaccine. You may need one or two doses if you smoke cigarettes or if you have certain conditions. Talk to your health care provider about which screenings and vaccines you need and how often you need them. This information is not intended to replace advice given to you by your health care provider. Make sure you discuss any questions you have with your health care provider. Document Released: 09/10/2015 Document Revised: 05/03/2016 Document Reviewed: 06/15/2015 Elsevier Interactive Patient Education  2017 [place] Prevention in the Home Falls can cause injuries. They can happen to people of all ages. There are many things you can do to make your home safe and to help prevent falls. What can I do on the outside of my home?  Regularly fix the edges of walkways and driveways and fix any cracks.  Remove anything that might make you trip as you walk through a door, such as a raised step or threshold.  Trim any bushes or trees on the path to your home.  Use bright outdoor lighting.  Clear any walking paths of anything that might make someone trip, such as rocks or tools.  Regularly check to see if handrails are loose or broken. Make sure that both sides of any steps have  handrails.  Any raised decks and porches should have guardrails on the edges.  Have any leaves, snow, or ice cleared regularly.  Use sand or salt on walking paths during winter.  Clean up any spills in your garage right away. This includes oil or grease spills. What can I do in the bathroom?  Use night lights.  Install grab bars by the toilet and in the tub and shower. Do not use towel bars as grab bars.  Use non-skid mats or decals in the tub or shower.  If you need to sit down in the shower, use a plastic, non-slip stool.  Keep the floor dry. Clean up any water that spills on the floor as soon as it happens.  Remove soap buildup in the tub or shower regularly.  Attach bath mats securely with double-sided non-slip rug tape.  Do not have throw rugs and other things on the floor that can make you trip. What can I do in the bedroom?  Use night lights.  Make sure that you have a light by your bed that is easy to reach.  Do not use any sheets or blankets that are too big for your bed. They should not hang down onto the floor.  Have a firm chair that has side  arms. You can use this for support while you get dressed.  Do not have throw rugs and other things on the floor that can make you trip. What can I do in the kitchen?  Clean up any spills right away.  Avoid walking on wet floors.  Keep items that you use a lot in easy-to-reach places.  If you need to reach something above you, use a strong step stool that has a grab bar.  Keep electrical cords out of the way.  Do not use floor polish or wax that makes floors slippery. If you must use wax, use non-skid floor wax.  Do not have throw rugs and other things on the floor that can make you trip. What can I do with my stairs?  Do not leave any items on the stairs.  Make sure that there are handrails on both sides of the stairs and use them. Fix handrails that are broken or loose. Make sure that handrails are as long as  the stairways.  Check any carpeting to make sure that it is firmly attached to the stairs. Fix any carpet that is loose or worn.  Avoid having throw rugs at the top or bottom of the stairs. If you do have throw rugs, attach them to the floor with carpet tape.  Make sure that you have a light switch at the top of the stairs and the bottom of the stairs. If you do not have them, ask someone to add them for you. What else can I do to help prevent falls?  Wear shoes that:  Do not have high heels.  Have rubber bottoms.  Are comfortable and fit you well.  Are closed at the toe. Do not wear sandals.  If you use a stepladder:  Make sure that it is fully opened. Do not climb a closed stepladder.  Make sure that both sides of the stepladder are locked into place.  Ask someone to hold it for you, if possible.  Clearly mark and make sure that you can see:  Any grab bars or handrails.  First and last steps.  Where the edge of each step is.  Use tools that help you move around (mobility aids) if they are needed. These include:  Canes.  Walkers.  Scooters.  Crutches.  Turn on the lights when you go into a dark area. Replace any light bulbs as soon as they burn out.  Set up your furniture so you have a clear path. Avoid moving your furniture around.  If any of your floors are uneven, fix them.  If there are any pets around you, be aware of where they are.  Review your medicines with your doctor. Some medicines can make you feel dizzy. This can increase your chance of falling. Ask your doctor what other things that you can do to help prevent falls. This information is not intended to replace advice given to you by your health care provider. Make sure you discuss any questions you have with your health care provider. Document Released: 06/10/2009 Document Revised: 01/20/2016 Document Reviewed: 09/18/2014 Elsevier Interactive Patient Education  2017 [employer].

## 2019-10-09 ENCOUNTER — Encounter (HOSPITAL_COMMUNITY): Payer: Medicare Other

## 2019-10-19 ENCOUNTER — Encounter: Payer: Self-pay | Admitting: Family Medicine

## 2019-10-19 DIAGNOSIS — G8929 Other chronic pain: Secondary | ICD-10-CM

## 2019-10-19 DIAGNOSIS — Z0289 Encounter for other administrative examinations: Secondary | ICD-10-CM

## 2019-10-19 DIAGNOSIS — M419 Scoliosis, unspecified: Secondary | ICD-10-CM

## 2019-10-20 MED ORDER — TRAMADOL HCL 50 MG PO TABS: 50.0000 mg | ORAL_TABLET | Freq: Three times a day (TID) | ORAL | 0 refills | Status: DC | PRN

## 2019-10-22 ENCOUNTER — Encounter: Payer: Self-pay | Admitting: *Deleted

## 2019-11-04 ENCOUNTER — Encounter: Payer: Self-pay | Admitting: Family Medicine

## 2019-11-06 ENCOUNTER — Encounter: Payer: Self-pay | Admitting: Physician Assistant

## 2019-11-06 ENCOUNTER — Other Ambulatory Visit: Payer: Self-pay

## 2019-11-06 ENCOUNTER — Ambulatory Visit (HOSPITAL_COMMUNITY)
Admission: RE | Admit: 2019-11-06 | Discharge: 2019-11-06 | Disposition: A | Discharge status: Home / Self Care | Payer: Medicare Other | Source: Ambulatory Visit | Attending: Vascular Surgery | Admitting: Vascular Surgery

## 2019-11-06 ENCOUNTER — Ambulatory Visit (INDEPENDENT_AMBULATORY_CARE_PROVIDER_SITE_OTHER): Payer: Medicare Other | Admitting: Physician Assistant

## 2019-11-06 VITALS — BP 113/76 | HR 64 | Temp 97.7°F | Resp 16 | Ht 66.5 in | Wt 213.0 lb

## 2019-11-06 DIAGNOSIS — I83811 Varicose veins of right lower extremities with pain: Secondary | ICD-10-CM | POA: Diagnosis not present

## 2019-11-06 DIAGNOSIS — R600 Localized edema: Secondary | ICD-10-CM | POA: Diagnosis not present

## 2019-11-06 NOTE — Progress Notes (Signed)
[facility] [place]   Reason for referral: Soreness and pain in medial ankle of right leg Varicose veins B.  History of Present Illness  Angela Herman is a 54 y.o. female who presents with chief complaint: swollen leg.  Patient notes, onset of varicose veins > 6 years ago.  They did not cause her pain and she denise non healing wounds.  She states her family has noticed the veins in her legs get more prominent with incrossed standing and activity.    She has medial ankle pain at times, without history of injury.  The is no swelling in her LE.    Past family history is unknown secondary to her being adopted.  She has an allergy to aspirin.  She takes Fenofibrate   Past Medical History:  Diagnosis Date  . Anxiety   . Asthma   . Bipolar 1 disorder ([place])   . Chronic anal fissure   . COPD (chronic obstructive pulmonary disease) ([place])   . Fibromyalgia   . GERD (gastroesophageal reflux disease)   . Hyperglycemia   . Hypertension   . Obesity   . Osteoarthritis   . Scoliosis   . Varicose veins of leg with pain, right   . Viral hepatitis C     Past Surgical History:  Procedure Laterality Date  . ABDOMINAL HYSTERECTOMY  2009  . CATARACT EXTRACTION    . CHOLECYSTECTOMY  2012  . CORNEA LACERATION REPAIR    . TUBAL LIGATION  1988    Social History   Socioeconomic History  . Marital status: Married    Spouse name: jimmie  . Number of children: 3  . Years of education: 71  . Highest education level: Bachelor's degree (e.g., BA, AB, BS)  Occupational History  . Occupation: disability  Tobacco Use  . Smoking status: Former Smoker    Packs/day: 0.50    Years: 36.00    Pack years: 18.00    Types: Cigarettes    Quit date: 04/16/2019    Years since quitting: 0.5  . Smokeless tobacco: Never Used  Substance and Sexual Activity  . Alcohol use: Never  . Drug use: Not Currently    Comment: marijuana in the past  . Sexual activity: Not Currently     Comment: husband  Other Topics Concern  . Not on file  Social History Narrative  . Not on file   Social Determinants of Health   Financial Resource Strain: Low Risk   . Difficulty of Paying Living Expenses: Not hard at all  Food Insecurity: No Food Insecurity  . Worried About Charity fundraiser in the Last Year: Never true  . Ran Out of Food in the Last Year: Never true  Transportation Needs: No Transportation Needs  . Lack of Transportation (Medical): No  . Lack of Transportation (Non-Medical): No  Physical Activity: Sufficiently Active  . Days of Exercise per Week: 7 days  . Minutes of Exercise per Session: 30 min  Stress: No Stress Concern Present  . Feeling of Stress : Only a little  Social Connections: Somewhat Isolated  . Frequency of Communication with Friends and Family: More than three times a week  . Frequency of Social Gatherings with Friends and Family: More than three times a week  . Attends Religious Services: Never  . Active Member of Clubs or Organizations: No  . Attends Archivist Meetings: Never  . Marital Status: Married  Human resources officer Violence: Not At  Risk  . Fear of Current or Ex-Partner: No  . Emotionally Abused: No  . Physically Abused: No  . Sexually Abused: No    Family History  Adopted: Yes  Problem Relation Age of Onset  . Bipolar disorder Daughter   . Drug abuse Daughter   . Hypertension Son     Current Outpatient Medications on File Prior to Visit  Medication Sig Dispense Refill  . albuterol (VENTOLIN HFA) 108 (90 Base) MCG/ACT inhaler Inhale 2 puffs into the lungs every 4 (four) hours as needed for wheezing or shortness of breath. 18 g 3  . baclofen (LIORESAL) 10 MG tablet Take 1 tablet (10 mg total) by mouth 3 (three) times daily. 270 each 3  . busPIRone (BUSPAR) 15 MG tablet Take 1 tablet (15 mg total) by mouth 3 (three) times daily. 270 tablet 1  . diclofenac sodium (VOLTAREN) 1 % GEL Apply 4 g topically 2 (two) times  daily. 100 g 4  . DULoxetine (CYMBALTA) 30 MG capsule Take 1 capsule (30 mg total) by mouth daily. 90 capsule 1  . fenofibrate micronized (LOFIBRA) 134 MG capsule Take 1 capsule (134 mg total) by mouth daily before breakfast. 90 capsule 1  . Fluticasone-Salmeterol (ADVAIR) 250-50 MCG/DOSE AEPB Inhale 1 puff into the lungs 2 (two) times daily. 60 each 5  . gabapentin (NEURONTIN) 300 MG capsule 300 mg po qam, 300 mg po q noon, and 900 mg po q bedtime 450 capsule 1  . ipratropium-albuterol (DUONEB) 0.5-2.5 (3) MG/3ML SOLN Take 3 mLs by nebulization every 6 (six) hours as needed (for worse cough, SOB and wheeze for COPD and asthma exacerbation). 180 mL 1  . lisinopril (ZESTRIL) 20 MG tablet Take 1 tablet (20 mg total) by mouth daily. 90 tablet 3  . ondansetron (ZOFRAN) 8 MG tablet Take 1 tablet (8 mg total) by mouth every 8 (eight) hours as needed for nausea or vomiting. 20 tablet 1  . pantoprazole (PROTONIX) 40 MG tablet Take 1 tablet (40 mg total) by mouth daily. 90 tablet 1  . Respiratory Therapy Supplies (NEBULIZER/TUBING/MOUTHPIECE) KIT Disp one nebulizer machine, tubing set and mouthpiece kit 1 kit 0  . traMADol (ULTRAM) 50 MG tablet Take 1 tablet (50 mg total) by mouth every 8 (eight) hours as needed for severe pain (for chronic back pain). 90 tablet 0   No current facility-administered medications on file prior to visit.    Allergies as of 11/06/2019 - Review Complete 11/06/2019  Allergen Reaction Noted  . Aspirin  06/13/2019  . [place]  06/13/2019  . Codeine  06/13/2019     ROS:   General:  No weight loss, Fever, chills  HEENT: No recent headaches, no nasal bleeding, no visual changes, no sore throat  Neurologic: No dizziness, blackouts, seizures. No recent symptoms of stroke or mini- stroke. No recent episodes of slurred speech, or temporary blindness.  Cardiac: No recent episodes of chest pain/pressure, no shortness of breath at rest.  No shortness of breath with exertion.   Denies history of atrial fibrillation or irregular heartbeat  Vascular: No history of rest pain in feet.  No history of claudication.  No history of non-healing ulcer, No history of DVT   Pulmonary: No home oxygen, no productive cough, no hemoptysis,  No asthma or wheezing  Musculoskeletal:  _0  Arthritis, _1  Low back pain,  [x ] Joint pain  Hematologic:No history of hypercoagulable state.  No history of easy bleeding.  No history of anemia  Gastrointestinal: No  hematochezia or melena,  No gastroesophageal reflux, no trouble swallowing  Urinary: _0  chronic Kidney disease, _1  on HD - _2  MWF or _3  TTHS, _4  Burning with urination, _5  Frequent urination, _6  Difficulty urinating;   Skin: No rashes  Psychological: No history of anxiety,  No history of depression  Physical Examination  Vitals:   11/06/19 1523  BP: 113/76  Pulse: 64  Resp: 16  Temp: 97.7 F (36.5 C)  TempSrc: Temporal  SpO2: 94%  Weight: 213 lb (96.6 kg)  Height: 5' 6.5" (1.689 m)    Body mass index is 33.86 kg/m.  General:  Alert and oriented, no acute distress HEENT: Normal Neck: No bruit or JVD Pulmonary: Clear to auscultation bilaterally Cardiac: Regular Rate and Rhythm without murmur Abdomen: Soft, non-tender, non-distended, no mass, no scars Skin: No rash Extremity Pulses:  2+ radial, brachial, femoral, dorsalis pedis, posterior tibial pulses bilaterally Musculoskeletal: No deformity or edema  Neurologic: Upper and lower extremity motor 5/5 and symmetric  DATA:   Venous Reflux Times  +--------------+---------+------+-----------+------------+--------+  RIGHT     Reflux NoRefluxReflux TimeDiameter cmsComments               Yes                   +--------------+---------+------+-----------+------------+--------+  GSV at SFJ                  0.54        +--------------+---------+------+-----------+------------+--------+   GSV prox thigh                0.57        +--------------+---------+------+-----------+------------+--------+  GSV mid thigh                 0.25        +--------------+---------+------+-----------+------------+--------+  GSV dist thigh                0.25        +--------------+---------+------+-----------+------------+--------+  GSV at knee                  0.20        +--------------+---------+------+-----------+------------+--------+  GSV prox calf               0.17 / 0.32       +--------------+---------+------+-----------+------------+--------+  GSV mid calf                 0.29        +--------------+---------+------+-----------+------------+--------+  SSV Pop Fossa                 0.26        +--------------+---------+------+-----------+------------+--------+  SSV prox calf                 0.17        +--------------+---------+------+-----------+------------+--------+  SSV mid calf       yes  >500 ms   0.29        +--------------+---------+------+-----------+------------+--------+     +--------------+---------+------+-----------+------------+--------+  LEFT     Reflux NoRefluxReflux TimeDiameter cmsComments               Yes                   +--------------+---------+------+-----------+------------+--------+  CFV            yes  >1 second             +--------------+---------+------+-----------+------------+--------+  GSV prox thigh  0.30 / 0.33       +--------------+---------+------+-----------+------------+--------+  GSV mid thigh                 0.24         +--------------+---------+------+-----------+------------+--------+  GSV dist thigh                0.36        +--------------+---------+------+-----------+------------+--------+  GSV at knee                  0.33        +--------------+---------+------+-----------+------------+--------+  GSV prox calf                 0.34        +--------------+---------+------+-----------+------------+--------+  GSV mid calf                 0.19        +--------------+---------+------+-----------+------------+--------+  SSV Pop Fossa                 0.37        +--------------+---------+------+-----------+------------+--------+  SSV prox calf                 0.30        +--------------+---------+------+-----------+------------+--------+  SSV mid calf                 0.25        +--------------+---------+------+-----------+------------+--------+         Summary:  Right:  - No evidence of deep vein thrombosis seen in the right lower extremity,  from the common femoral through the popliteal veins.  - No reflux in the deep system.  - No evidence of superficial venous reflux seen in the right greater  saphenous vein.  -Reflux in a varicosity in the proximal to mid calf.  - Venous reflux is noted in the right short saphenous vein, mid calf     Left:  - No evidence of deep vein thrombosis seen in the left lower extremity,  from the common femoral through the popliteal veins.  - There is evidence of venous reflux seen in the common femoral vein in  the left lower extremity.  - No evidence of superficial venous reflux seen in the left greater  saphenous vein.  - No evidence of superficial venous reflux seen in the left short  saphenous vein.     Assessment: Negative GSV reflux Positive varicose veins  surrounding B knees Reflux in a varicosity in the proximal to right mid calf  Plan: She has palpable pedal pulse and is not at risk of losing toes or extremities.  She has a negative history for non healing wounds and no skin changes.  There is a small varicosity in the right mid calf.    I suggested knee high compression during the day.  She is not a candidate   for intervention without reflux.  F/U as needed.   Roxy Horseman PA-C [facility] [place] Office: [phone]  MD in clinic [place]

## 2019-11-16 ENCOUNTER — Encounter: Payer: Self-pay | Admitting: Family Medicine

## 2019-11-16 DIAGNOSIS — M5441 Lumbago with sciatica, right side: Secondary | ICD-10-CM

## 2019-11-16 DIAGNOSIS — M419 Scoliosis, unspecified: Secondary | ICD-10-CM

## 2019-11-16 DIAGNOSIS — G8929 Other chronic pain: Secondary | ICD-10-CM

## 2019-11-16 DIAGNOSIS — Z0289 Encounter for other administrative examinations: Secondary | ICD-10-CM

## 2019-11-17 MED ORDER — TRAMADOL HCL 50 MG PO TABS: 50.0000 mg | ORAL_TABLET | Freq: Three times a day (TID) | ORAL | 0 refills | Status: DC | PRN

## 2019-12-03 ENCOUNTER — Other Ambulatory Visit: Payer: Self-pay | Admitting: Family Medicine

## 2019-12-03 ENCOUNTER — Encounter: Payer: Self-pay | Admitting: Family Medicine

## 2019-12-03 DIAGNOSIS — M797 Fibromyalgia: Secondary | ICD-10-CM

## 2019-12-03 DIAGNOSIS — M419 Scoliosis, unspecified: Secondary | ICD-10-CM

## 2019-12-16 ENCOUNTER — Encounter: Payer: Self-pay | Admitting: Family Medicine

## 2019-12-16 DIAGNOSIS — G8929 Other chronic pain: Secondary | ICD-10-CM

## 2019-12-16 DIAGNOSIS — M419 Scoliosis, unspecified: Secondary | ICD-10-CM

## 2019-12-16 DIAGNOSIS — N179 Acute kidney failure, unspecified: Secondary | ICD-10-CM

## 2019-12-16 DIAGNOSIS — Z0289 Encounter for other administrative examinations: Secondary | ICD-10-CM

## 2019-12-16 MED ORDER — TRAMADOL HCL 50 MG PO TABS: 50.0000 mg | ORAL_TABLET | Freq: Three times a day (TID) | ORAL | 0 refills | Status: DC | PRN

## 2019-12-16 NOTE — Telephone Encounter (Signed)
Indication for chronic opioid: chronic back pain Medication and dose: tramadol 50 mg TID  # pills per month: 90 Reviewed PDMP today, no concerns Refills put in with fill dates for 12/18/2019, 01/17/2020, 02/17/2020  Pt notified through [facility], PA-C 12/16/19 10:07 AM

## 2019-12-18 ENCOUNTER — Other Ambulatory Visit: Payer: Self-pay | Admitting: Family Medicine

## 2019-12-18 DIAGNOSIS — M419 Scoliosis, unspecified: Secondary | ICD-10-CM

## 2019-12-18 DIAGNOSIS — K219 Gastro-esophageal reflux disease without esophagitis: Secondary | ICD-10-CM

## 2019-12-18 DIAGNOSIS — M797 Fibromyalgia: Secondary | ICD-10-CM

## 2019-12-18 DIAGNOSIS — E785 Hyperlipidemia, unspecified: Secondary | ICD-10-CM

## 2019-12-29 ENCOUNTER — Other Ambulatory Visit: Payer: Self-pay | Admitting: Family Medicine

## 2019-12-29 DIAGNOSIS — M797 Fibromyalgia: Secondary | ICD-10-CM

## 2019-12-29 DIAGNOSIS — M419 Scoliosis, unspecified: Secondary | ICD-10-CM

## 2020-01-12 ENCOUNTER — Encounter: Payer: Self-pay | Admitting: Family Medicine

## 2020-01-12 ENCOUNTER — Ambulatory Visit (INDEPENDENT_AMBULATORY_CARE_PROVIDER_SITE_OTHER): Payer: Medicare Other | Admitting: Family Medicine

## 2020-01-12 ENCOUNTER — Other Ambulatory Visit: Payer: Self-pay

## 2020-01-12 VITALS — BP 122/76 | HR 98 | Temp 97.8°F | Resp 18 | Ht 67.0 in | Wt 212.7 lb

## 2020-01-12 DIAGNOSIS — E785 Hyperlipidemia, unspecified: Secondary | ICD-10-CM

## 2020-01-12 DIAGNOSIS — F419 Anxiety disorder, unspecified: Secondary | ICD-10-CM

## 2020-01-12 DIAGNOSIS — J449 Chronic obstructive pulmonary disease, unspecified: Secondary | ICD-10-CM

## 2020-01-12 DIAGNOSIS — M419 Scoliosis, unspecified: Secondary | ICD-10-CM

## 2020-01-12 DIAGNOSIS — Z5181 Encounter for therapeutic drug level monitoring: Secondary | ICD-10-CM

## 2020-01-12 DIAGNOSIS — I10 Essential (primary) hypertension: Secondary | ICD-10-CM

## 2020-01-12 DIAGNOSIS — G894 Chronic pain syndrome: Secondary | ICD-10-CM

## 2020-01-12 DIAGNOSIS — R7303 Prediabetes: Secondary | ICD-10-CM

## 2020-01-12 DIAGNOSIS — K219 Gastro-esophageal reflux disease without esophagitis: Secondary | ICD-10-CM

## 2020-01-12 DIAGNOSIS — J45909 Unspecified asthma, uncomplicated: Secondary | ICD-10-CM

## 2020-01-12 DIAGNOSIS — M797 Fibromyalgia: Secondary | ICD-10-CM

## 2020-01-12 MED ORDER — DULOXETINE HCL 20 MG PO CPEP: 40.0000 mg | ORAL_CAPSULE | Freq: Every day | ORAL | 3 refills | Status: DC

## 2020-01-12 MED ORDER — PANTOPRAZOLE SODIUM 40 MG PO TBEC: 40.0000 mg | DELAYED_RELEASE_TABLET | Freq: Every day | ORAL | 1 refills | Status: DC

## 2020-01-12 MED ORDER — FLUTICASONE-SALMETEROL 250-50 MCG/DOSE IN AEPB: 1.0000 | INHALATION_SPRAY | Freq: Two times a day (BID) | RESPIRATORY_TRACT | 11 refills | Status: AC

## 2020-01-12 NOTE — Progress Notes (Signed)
Name: Angela Herman   MRN: [id number]    DOB: 1965-10-15   Date:01/12/2020       Progress Note  Chief Complaint  Patient presents with  . Hyperlipidemia    6 month follow up  . Anxiety  . Gastroesophageal Reflux  . Hypertension  . Back Pain     Subjective:   Angela Herman is a 54 y.o. female, presents to clinic for routine follow up on the conditions listed above.  Hyperlipidemia: Current Medication Regimen:  Fenofibrate   Last Lipids: Lab Results  Component Value Date   CHOL 180 06/13/2019   HDL 39 (L) 06/13/2019   LDLCALC 112 (H) 06/13/2019   TRIG 177 (H) 06/13/2019   CHOLHDL 4.6 06/13/2019   - Current Diet: Trying to eat healthier - Denies: Chest pain, shortness of breath, myalgias. - Documented aortic atherosclerosis? No - Risk factors for atherosclerosis: hypercholesterolemia and hypertension  The 10-year ASCVD risk score Mikey Bussing DC Jr., et al., 2013) is: 3%   Values used to calculate the score:     Age: 81 years     Sex: Female     Is Non-Hispanic African American: No     Diabetic: No     Tobacco smoker: No     Systolic Blood Pressure: 953 mmHg     Is BP treated: Yes     HDL Cholesterol: 39 mg/dL     Total Cholesterol: 180 mg/dL Discussed ASCVD risk score and statin medications today   Hypertension:  Currently managed on lisinopril 20 mg daily  Pt reports good med compliance and denies any SE.  No lightheadedness, hypotension, syncope. Blood pressure today is well controlled. BP Readings from Last 3 Encounters:  01/12/20 122/76  11/06/19 113/76  07/14/19 (!) 146/84  Pt denies CP, SOB, exertional sx, LE edema, palpitation, Ha's, visual disturbances   Patient endorses fatigue and working more - trying to supplement with B-12, other vitamins she is asking about calcium, magnesium, zinc supplement and she is also taking vitamin D supplement She does have history of chronic pain and fibromyalgia so she has a long history of feeling fatigued.  She states that  her chronic pain seems fairly well controlled right now on Voltaren gel, Cymbalta, tramadol, gabapentin and baclofen.  With her increased fatigue lately and she is wearing about her daughter a lot and her son she wonders if she can increase Cymbalta she is been taking 30 mg but does not really want to increase to 60 mg daily.  history of asthma and COPD -she is compliant with her Advair daily, uses her rescue inhaler occasionally, no change or worsening of sx  History of prediabetes, last A1c was 6.2 she has been working on eating healthier she has been working a lot and is been doing a lot of physical work, she would like to recheck this.  GERD: Her GI symptoms have improved taking Protonix daily she is on 40 mg Protonix dose  Patient Active Problem List   Diagnosis Date Noted  . Dyslipidemia 06/13/2019  . Varicose veins of leg with pain, right 06/13/2019  . Bilateral lower extremity edema 06/13/2019  . Scoliosis of thoracic spine 06/13/2019  . Fibromyalgia 06/13/2019  . Asthma 06/13/2019  . Chronic obstructive pulmonary disease ([place]) 06/13/2019  . Prediabetes 06/13/2019  . Essential hypertension 06/13/2019  . Gastroesophageal reflux disease 06/13/2019  . Anxiety disorder 06/13/2019    Past Surgical History:  Procedure Laterality Date  . ABDOMINAL HYSTERECTOMY  2009  . CATARACT EXTRACTION    .  CHOLECYSTECTOMY  2012  . CORNEA LACERATION REPAIR    . TUBAL LIGATION  1988    Family History  Adopted: Yes  Problem Relation Age of Onset  . Bipolar disorder Daughter   . Drug abuse Daughter   . Hypertension Son     Social History   Tobacco Use  . Smoking status: Former Smoker    Packs/day: 0.50    Years: 36.00    Pack years: 18.00    Types: Cigarettes    Quit date: 04/16/2019    Years since quitting: 0.7  . Smokeless tobacco: Never Used  Substance Use Topics  . Alcohol use: Never  . Drug use: Not Currently    Comment: marijuana in the past      Current Outpatient  Medications:  .  albuterol (VENTOLIN HFA) 108 (90 Base) MCG/ACT inhaler, Inhale 2 puffs into the lungs every 4 (four) hours as needed for wheezing or shortness of breath., Disp: 18 g, Rfl: 3 .  baclofen (LIORESAL) 10 MG tablet, Take 1 tablet (10 mg total) by mouth 3 (three) times daily., Disp: 270 each, Rfl: 3 .  busPIRone (BUSPAR) 15 MG tablet, TAKE 1 TABLET (15 MG TOTAL) BY MOUTH 3 (THREE) TIMES DAILY., Disp: 270 tablet, Rfl: 1 .  diclofenac Sodium (VOLTAREN) 1 % GEL, APPLY 4 G TOPICALLY 2 (TWO) TIMES DAILY., Disp: 100 g, Rfl: 11 .  DULoxetine (CYMBALTA) 30 MG capsule, TAKE 1 CAPSULE BY MOUTH EVERY DAY, Disp: 90 capsule, Rfl: 1 .  fenofibrate micronized (LOFIBRA) 134 MG capsule, TAKE 1 CAPSULE (134 MG TOTAL) BY MOUTH DAILY BEFORE BREAKFAST., Disp: 90 capsule, Rfl: 1 .  Fluticasone-Salmeterol (ADVAIR) 250-50 MCG/DOSE AEPB, Inhale 1 puff into the lungs 2 (two) times daily., Disp: 60 each, Rfl: 5 .  gabapentin (NEURONTIN) 300 MG capsule, TAKE 1 CAP EVERY AM ,1 AT NOON AND 3 AT BEDTIME, Disp: 450 capsule, Rfl: 1 .  ipratropium-albuterol (DUONEB) 0.5-2.5 (3) MG/3ML SOLN, Take 3 mLs by nebulization every 6 (six) hours as needed (for worse cough, SOB and wheeze for COPD and asthma exacerbation)., Disp: 180 mL, Rfl: 1 .  lisinopril (ZESTRIL) 20 MG tablet, Take 1 tablet (20 mg total) by mouth daily., Disp: 90 tablet, Rfl: 3 .  ondansetron (ZOFRAN) 8 MG tablet, TAKE 1 TABLET (8 MG TOTAL) BY MOUTH EVERY 8 (EIGHT) HOURS AS NEEDED FOR NAUSEA OR VOMITING., Disp: 20 tablet, Rfl: 1 .  pantoprazole (PROTONIX) 40 MG tablet, TAKE 1 TABLET BY MOUTH EVERY DAY, Disp: 90 tablet, Rfl: 1 .  [START ON 01/17/2020] traMADol (ULTRAM) 50 MG tablet, Take 1 tablet (50 mg total) by mouth 3 (three) times daily as needed for severe pain (for chronic back pain)., Disp: 90 tablet, Rfl: 0 .  traMADol (ULTRAM) 50 MG tablet, Take 1 tablet (50 mg total) by mouth every 8 (eight) hours as needed for severe pain (chronic back pain)., Disp: 90  tablet, Rfl: 0 .  [START ON 02/17/2020] traMADol (ULTRAM) 50 MG tablet, Take 1 tablet (50 mg total) by mouth 3 (three) times daily as needed for severe pain (severe chronic back pain management)., Disp: 90 tablet, Rfl: 0 .  Respiratory Therapy Supplies (NEBULIZER/TUBING/MOUTHPIECE) KIT, Disp one nebulizer machine, tubing set and mouthpiece kit (Patient not taking: Reported on 01/12/2020), Disp: 1 kit, Rfl: 0  Allergies  Allergen Reactions  . Aspirin   . [place]   . Codeine     Chart Review Today: I personally reviewed active problem list, medication list, allergies, family history, social history,  health maintenance, notes from last encounter, lab results, imaging with the patient/caregiver today.   Review of Systems  10 Systems reviewed and are negative for acute change except as noted in the HPI.  Objective:    Vitals:   01/12/20 1114  BP: 122/76  Pulse: 98  Resp: 18  Temp: 97.8 F (36.6 C)  TempSrc: Temporal  SpO2: 95%  Weight: 212 lb 11.2 oz (96.5 kg)  Height: 5' 7" (1.702 m)    Body mass index is 33.31 kg/m.  Physical Exam Vitals and nursing note reviewed.  Constitutional:      General: She is not in acute distress.    Appearance: Normal appearance. She is well-developed. She is obese. She is not ill-appearing, toxic-appearing or diaphoretic.     Interventions: Face mask in place.  HENT:     Head: Normocephalic and atraumatic.     Right Ear: External ear normal.     Left Ear: External ear normal.  Eyes:     General: Lids are normal. No scleral icterus.       Right eye: No discharge.        Left eye: No discharge.     Conjunctiva/sclera: Conjunctivae normal.  Neck:     Trachea: Phonation normal. No tracheal deviation.  Cardiovascular:     Rate and Rhythm: Normal rate and regular rhythm.     Pulses: Normal pulses.          Radial pulses are 2+ on the right side and 2+ on the left side.       Posterior tibial pulses are 2+ on the right side and 2+ on the left  side.     Heart sounds: Normal heart sounds. No murmur. No friction rub. No gallop.   Pulmonary:     Effort: Pulmonary effort is normal. No respiratory distress.     Breath sounds: Normal breath sounds. No stridor. No wheezing, rhonchi or rales.  Chest:     Chest wall: No tenderness.  Abdominal:     General: Bowel sounds are normal. There is no distension.     Palpations: Abdomen is soft.     Tenderness: There is no abdominal tenderness. There is no guarding or rebound.  Musculoskeletal:     Right lower leg: No edema.     Left lower leg: No edema.  Lymphadenopathy:     Cervical: No cervical adenopathy.  Skin:    General: Skin is warm and dry.     Coloration: Skin is not jaundiced or pale.     Findings: No rash.  Neurological:     Mental Status: She is alert. Mental status is at baseline.     Motor: No abnormal muscle tone.     Coordination: Coordination normal.     Gait: Gait normal.  Psychiatric:        Mood and Affect: Mood normal.        Speech: Speech normal.        Behavior: Behavior normal.       PHQ2/9: Depression screen [facility] 2/9 01/12/2020 09/25/2019 07/14/2019 06/13/2019  Decreased Interest 0 1 0 0  Down, Depressed, Hopeless 0 1 1 0  PHQ - 2 Score 0 2 1 0  Altered sleeping 0 3 0 0  Tired, decreased energy 0 2 0 0  Change in appetite 0 0 0 0  Feeling bad or failure about yourself  0 0 0 0  Trouble concentrating 0 1 0 0  Moving slowly or fidgety/restless 0  1 0 0  Suicidal thoughts 0 0 0 0  PHQ-9 Score 0 9 1 0  Difficult doing work/chores Not difficult at all Somewhat difficult Not difficult at all Not difficult at all    phq 9 is neg, doing well  Fall Risk: Fall Risk  01/12/2020 09/25/2019 07/14/2019 06/13/2019  Falls in the past year? 0 0 0 0  Number falls in past yr: 0 0 0 0  Injury with Fall? 0 0 0 0  Risk for fall due to : - No Fall Risks - -  Follow up Falls evaluation completed Falls prevention discussed - -    Functional Status Survey: Is the  patient deaf or have difficulty hearing?: No Does the patient have difficulty seeing, even when wearing glasses/contacts?: No Does the patient have difficulty concentrating, remembering, or making decisions?: No Does the patient have difficulty walking or climbing stairs?: No Does the patient have difficulty dressing or bathing?: No Does the patient have difficulty doing errands alone such as visiting a doctor's office or shopping?: No   Assessment & Plan:     ICD-10-CM   1. Essential hypertension  Z61 COMPLETE METABOLIC PANEL WITH GFR   Stable, well-controlled on lisinopril 20 mg check labs  2. Dyslipidemia  E78.5 Lipid panel    COMPLETE METABOLIC PANEL WITH GFR   Discussed her ASCVD risk score, advised that a statin would be preferred to her fenofibrate and she was open to try it, due for labs today  3. Gastroesophageal reflux disease, unspecified whether esophagitis present  K21.9 pantoprazole (PROTONIX) 40 MG tablet   After switching to Protonix from omeprazole her GI symptoms have significantly improved   4. Chronic obstructive pulmonary disease, unspecified COPD type ([place])  J44.9 Fluticasone-Salmeterol (ADVAIR) 250-50 MCG/DOSE AEPB   hx of asthma and COPD, former smoker, symptoms seem stable with dose increase on maintenence inhaler, no recent changes or sx  5. Asthma, unspecified asthma severity, unspecified whether complicated, unspecified whether persistent  J45.909 Fluticasone-Salmeterol (ADVAIR) 250-50 MCG/DOSE AEPB   Hx of asthma, recent COPD dx on chart from last PCP - pt has hx of fall and spring exacerbation, currently doing well, lungs clear  6. Fibromyalgia  M79.7 DULoxetine (CYMBALTA) 20 MG capsule   Current management is doing well for her pain but she is fatigued she asks about increasing Cymbalta from 30 mg daily to 40 mg  7. Scoliosis of thoracic spine, unspecified scoliosis type  M41.9 DULoxetine (CYMBALTA) 20 MG capsule   Patient established here reporting chronic  pain to her back, she has not seen a specialist consider referral for evaluation locally  8. Chronic pain syndrome  G89.4 DULoxetine (CYMBALTA) 20 MG capsule   See above particularly fibromyalgia, compliant with pain medications and office visits on tramadol, gabapentin, baclofen, diclofenac gel, Cymbalta  9. Anxiety disorder, unspecified type  F41.9 DULoxetine (CYMBALTA) 20 MG capsule   Patient is working a lot and is worried that her 2 kids, does feel like she could benefit from a slight dose increase in Cymbalta, increase to 40 mg daily  10. Prediabetes  W96.04 COMPLETE METABOLIC PANEL WITH GFR    Hemoglobin A1c   Recheck A1c, patient has been trying to be healthier  11. Encounter for medication monitoring  Z51.81 CBC with Differential/Platelet    Lipid panel    COMPLETE METABOLIC PANEL WITH GFR    Hemoglobin A1c     Return for 4 month f/up (routine f/up)   CPE if she can do one with insurance (  medicare and medicaid) .   Delsa Grana, PA-C 01/12/20 11:50 AM

## 2020-01-13 ENCOUNTER — Other Ambulatory Visit: Payer: Self-pay | Admitting: Family Medicine

## 2020-01-13 DIAGNOSIS — N179 Acute kidney failure, unspecified: Secondary | ICD-10-CM

## 2020-01-13 LAB — COMPLETE METABOLIC PANEL WITH GFR
AG Ratio: 1.6 (calc) (ref 1.0–2.5)
ALT: 33 U/L — ABNORMAL HIGH (ref 6–29)
AST: 25 U/L (ref 10–35)
Albumin: 4.1 g/dL (ref 3.6–5.1)
Alkaline phosphatase (APISO): 95 U/L (ref 37–153)
BUN/Creatinine Ratio: 5 (calc) — ABNORMAL LOW (ref 6–22)
BUN: 10 mg/dL (ref 7–25)
CO2: 27 mmol/L (ref 20–32)
Calcium: 9.6 mg/dL (ref 8.6–10.4)
Chloride: 103 mmol/L (ref 98–110)
Creat: 2.19 mg/dL — ABNORMAL HIGH (ref 0.50–1.05)
GFR, Est African American: 28 mL/min/{1.73_m2} — ABNORMAL LOW (ref >=60)
GFR, Est Non African American: 25 mL/min/{1.73_m2} — ABNORMAL LOW (ref >=60)
Globulin: 2.6 g/dL (calc) (ref 1.9–3.7)
Glucose, Bld: 117 mg/dL — ABNORMAL HIGH (ref 65–99)
Potassium: 4 mmol/L (ref 3.5–5.3)
Sodium: 137 mmol/L (ref 135–146)
Total Bilirubin: 0.4 mg/dL (ref 0.2–1.2)
Total Protein: 6.7 g/dL (ref 6.1–8.1)

## 2020-01-13 LAB — CBC WITH DIFFERENTIAL/PLATELET
Absolute Monocytes: 946 cells/uL (ref 200–950)
Basophils Absolute: 66 cells/uL (ref 0–200)
Basophils Relative: 0.6 %
Eosinophils Absolute: 264 cells/uL (ref 15–500)
Eosinophils Relative: 2.4 %
HCT: 42.9 % (ref 35.0–45.0)
Hemoglobin: 14.3 g/dL (ref 11.7–15.5)
Lymphs Abs: 3300 cells/uL (ref 850–3900)
MCH: 30.2 pg (ref 27.0–33.0)
MCHC: 33.3 g/dL (ref 32.0–36.0)
MCV: 90.5 fL (ref 80.0–100.0)
MPV: 12.1 fL (ref 7.5–12.5)
Monocytes Relative: 8.6 %
Neutro Abs: 6424 cells/uL (ref 1500–7800)
Neutrophils Relative %: 58.4 %
Platelets: 292 10*3/uL (ref 140–400)
RBC: 4.74 10*6/uL (ref 3.80–5.10)
RDW: 12.2 % (ref 11.0–15.0)
Total Lymphocyte: 30 %
WBC: 11 10*3/uL — ABNORMAL HIGH (ref 3.8–10.8)

## 2020-01-13 LAB — LIPID PANEL
Cholesterol: 182 mg/dL (ref <200)
HDL: 37 mg/dL — ABNORMAL LOW (ref >=50)
LDL Cholesterol (Calc): 115 mg/dL (calc) — ABNORMAL HIGH
Non-HDL Cholesterol (Calc): 145 mg/dL (calc) — ABNORMAL HIGH (ref <130)
Total CHOL/HDL Ratio: 4.9 (calc) (ref <5.0)
Triglycerides: 189 mg/dL — ABNORMAL HIGH (ref <150)

## 2020-01-13 LAB — HEMOGLOBIN A1C
Hgb A1c MFr Bld: 7.3 % of total Hgb — ABNORMAL HIGH (ref <5.7)
Mean Plasma Glucose: 163 (calc)
eAG (mmol/L): 9 (calc)

## 2020-01-13 NOTE — Telephone Encounter (Signed)
-----   Message from Danelle Berry, [place] sent at 01/13/2020  4:47 PM EDT ----- Please notify pt of labwork   Her kidney function is drastically worse - consistent with acute kidney injury - can occur with illness or severe dehydration -  She is at risk for kidney failure - I'm not sure why it would be worse so suddenly -   Ask if she has been ill?  (we talked yesterday about fatigue no illness)  Is she peeing? Drinking enough?  If she feels bad or is confused, not peeing, lightheaded then she needs to go to the ER  If she feels fine - needs to push clear fluids and come repeat labs here or at the hospital in the next 2-3 days

## 2020-01-14 ENCOUNTER — Telehealth: Payer: Self-pay

## 2020-01-14 NOTE — Telephone Encounter (Signed)
Can you call and add patient to schedule tomorrow at 2 per leisa for abnormal labs

## 2020-01-14 NOTE — Telephone Encounter (Signed)
-----   Message from Danelle Berry, [place] sent at 01/13/2020  4:58 PM EDT ----- Sorry - please call back  She needs to hold her lisinopril - dose would change if kidney funciton changes - for now just hold it until we check her labs  She has A1C increased to Dx of Diabetes -  If there is a spot open Thursday can you squeeze her into it?    She has a white count up, cholesterol and triglycerides were high, kidney function was low and A1C was high

## 2020-01-15 ENCOUNTER — Encounter: Payer: Self-pay | Admitting: Family Medicine

## 2020-01-15 ENCOUNTER — Other Ambulatory Visit: Payer: Self-pay

## 2020-01-15 ENCOUNTER — Ambulatory Visit
Admission: RE | Admit: 2020-01-15 | Discharge: 2020-01-15 | Disposition: A | Discharge status: Home / Self Care | Payer: Medicare Other | Attending: Family Medicine | Admitting: Family Medicine

## 2020-01-15 ENCOUNTER — Ambulatory Visit (INDEPENDENT_AMBULATORY_CARE_PROVIDER_SITE_OTHER): Payer: Medicare Other | Admitting: Family Medicine

## 2020-01-15 ENCOUNTER — Ambulatory Visit
Admission: RE | Admit: 2020-01-15 | Discharge: 2020-01-15 | Disposition: A | Discharge status: Home / Self Care | Payer: Medicare Other | Source: Ambulatory Visit | Attending: Family Medicine | Admitting: Family Medicine

## 2020-01-15 VITALS — BP 134/84 | HR 73 | Temp 98.1°F | Resp 14 | Ht 67.0 in | Wt 215.3 lb

## 2020-01-15 DIAGNOSIS — R5383 Other fatigue: Secondary | ICD-10-CM | POA: Diagnosis present

## 2020-01-15 DIAGNOSIS — N179 Acute kidney failure, unspecified: Secondary | ICD-10-CM | POA: Diagnosis not present

## 2020-01-15 DIAGNOSIS — J441 Chronic obstructive pulmonary disease with (acute) exacerbation: Secondary | ICD-10-CM

## 2020-01-15 DIAGNOSIS — J329 Chronic sinusitis, unspecified: Secondary | ICD-10-CM

## 2020-01-15 DIAGNOSIS — R062 Wheezing: Secondary | ICD-10-CM | POA: Diagnosis present

## 2020-01-15 DIAGNOSIS — E119 Type 2 diabetes mellitus without complications: Secondary | ICD-10-CM

## 2020-01-15 DIAGNOSIS — J310 Chronic rhinitis: Secondary | ICD-10-CM | POA: Diagnosis not present

## 2020-01-15 DIAGNOSIS — E785 Hyperlipidemia, unspecified: Secondary | ICD-10-CM

## 2020-01-15 DIAGNOSIS — G894 Chronic pain syndrome: Secondary | ICD-10-CM | POA: Insufficient documentation

## 2020-01-15 MED ORDER — LANCETS MISC: 1.0000 "application " | Freq: Every day | 3 refills | Status: DC

## 2020-01-15 MED ORDER — BLOOD GLUCOSE MONITOR KIT: PACK | 0 refills | Status: DC

## 2020-01-15 MED ORDER — DOXYCYCLINE HYCLATE 100 MG PO TABS: 100.0000 mg | ORAL_TABLET | Freq: Two times a day (BID) | ORAL | 0 refills | Status: AC

## 2020-01-15 MED ORDER — GLUCOSE METER TEST VI STRP: ORAL_STRIP | 3 refills | Status: DC

## 2020-01-15 MED ORDER — PREDNISONE 20 MG PO TABS: ORAL_TABLET | ORAL | 0 refills | Status: DC

## 2020-01-15 NOTE — Patient Instructions (Addendum)
If you have any shortness of breath, chest pain, or near - passing out episodes - go to the ER or call 911.  If you become confused, lethargic, stop urinating, have trouble with physical/strenuous activity - please go to the ER.  Go get your chest X-ray today across the street.   Acute Kidney Injury, Adult  Acute kidney injury is a sudden worsening of kidney function. The kidneys are organs that have several jobs. They filter the blood to remove waste products and extra fluid. They also maintain a healthy balance of minerals and hormones in the body, which helps control blood pressure and keep bones strong. With this condition, your kidneys do not do their jobs as well as they should. This condition ranges from mild to severe. Over time it may develop into long-lasting (chronic) kidney disease. Early detection and treatment may prevent acute kidney injury from developing into a chronic condition. What are the causes? Common causes of this condition include:  A problem with blood flow to the kidneys. This may be caused by: ? Low blood pressure (hypotension) or shock. ? Blood loss. ? Heart and blood vessel (cardiovascular) disease. ? Severe burns. ? Liver disease.  Direct damage to the kidneys. This may be caused by: ? Certain medicines. ? A kidney infection. ? Poisoning. ? Being around or in contact with toxic substances. ? A surgical wound. ? A hard, direct hit to the kidney area.  A sudden blockage of urine flow. This may be caused by: ? Cancer. ? Kidney stones. ? An enlarged prostate in males. What are the signs or symptoms? Symptoms of this condition may not be obvious until the condition becomes severe. Symptoms of this condition can include:  Tiredness (lethargy), or difficulty staying awake.  Nausea or vomiting.  Swelling (edema) of the face, legs, ankles, or feet.  Problems with urination, such as: ? Abdominal pain, or pain along the side of your stomach  (flank). ? Decreased urine production. ? Decrease in the force of urine flow.  Muscle twitches and cramps, especially in the legs.  Confusion or trouble concentrating.  Loss of appetite.  Fever. How is this diagnosed? This condition may be diagnosed with tests, including:  Blood tests.  Urine tests.  Imaging tests.  A test in which a sample of tissue is removed from the kidneys to be examined under a microscope (kidney biopsy). How is this treated? Treatment for this condition depends on the cause and how severe the condition is. In mild cases, treatment may not be needed. The kidneys may heal on their own. In more severe cases, treatment will involve:  Treating the cause of the kidney injury. This may involve changing any medicines you are taking or adjusting your dosage.  Fluids. You may need specialized IV fluids to balance your body's needs.  Having a catheter placed to drain urine and prevent blockages.  Preventing problems from occurring. This may mean avoiding certain medicines or procedures that can cause further injury to the kidneys. In some cases treatment may also require:  A procedure to remove toxic wastes from the body (dialysis or continuous renal replacement therapy - CRRT).  Surgery. This may be done to repair a torn kidney, or to remove the blockage from the urinary system. Follow these instructions at home: Medicines  Take over-the-counter and prescription medicines only as told by your health care provider.  Do not take any new medicines without your health care provider's approval. Many medicines can worsen your kidney damage.  Do not take any vitamin and mineral supplements without your health care provider's approval. Many nutritional supplements can worsen your kidney damage. Lifestyle  If your health care provider prescribed changes to your diet, follow them. You may need to decrease the amount of protein you eat.  Achieve and maintain a  healthy weight. If you need help with this, ask your health care provider.  Start or continue an exercise plan. Try to exercise at least 30 minutes a day, 5 days a week.  Do not use any tobacco products, such as cigarettes, chewing tobacco, and e-cigarettes. If you need help quitting, ask your health care provider. General instructions  Keep track of your blood pressure. Report changes in your blood pressure as told by your health care provider.  Stay up to date with immunizations. Ask your health care provider which immunizations you need.  Keep all follow-up visits as told by your health care provider. This is important. Where to find more information  American Association of Kidney Patients: BombTimer.gl  National Kidney Foundation: www.kidney.[place]: https://mathis.com/  Life Options Rehabilitation Program: ? www.lifeoptions.org ? www.kidneyschool.org Contact a health care provider if:  Your symptoms get worse.  You develop new symptoms. Get help right away if:  You develop symptoms of worsening kidney disease, which include: ? Headaches. ? Abnormally dark or light skin. ? Easy bruising. ? Frequent hiccups. ? Chest pain. ? Shortness of breath. ? End of menstruation in women. ? Seizures. ? Confusion or altered mental status. ? Abdominal or back pain. ? Itchiness.  You have a fever.  Your body is producing less urine.  You have pain or bleeding when you urinate. Summary  Acute kidney injury is a sudden worsening of kidney function.  Acute kidney injury can be caused by problems with blood flow to the kidneys, direct damage to the kidneys, and sudden blockage of urine flow.  Symptoms of this condition may not be obvious until it becomes severe. Symptoms may include edema, lethargy, confusion, nausea or vomiting, and problems passing urine.  This condition can usually be diagnosed with blood tests, urine tests, and imaging tests. Sometimes a  kidney biopsy is done to diagnose this condition.  Treatment for this condition often involves treating the underlying cause. It is treated with fluids, medicines, dialysis, diet changes, or surgery. This information is not intended to replace advice given to you by your health care provider. Make sure you discuss any questions you have with your health care provider. Document Revised: 07/27/2017 Document Reviewed: 08/04/2016 Elsevier Patient Education  [place] of Breath, Adult Shortness of breath means you have trouble breathing. Shortness of breath could be a sign of a medical problem. Follow these instructions at home:   Watch for any changes in your symptoms.  Do not use any products that contain nicotine or tobacco, such as cigarettes, e-cigarettes, and chewing tobacco.  Do not smoke. Smoking can cause shortness of breath. If you need help to quit smoking, ask your doctor.  Avoid things that can make it harder to breathe, such as: ? Mold. ? Dust. ? Air pollution. ? Chemical smells. ? Things that can cause allergy symptoms (allergens), if you have allergies.  Keep your living space clean. Use products that help remove mold and dust.  Rest as needed. Slowly return to your normal activities.  Take over-the-counter and prescription medicines only as told by your doctor. This includes oxygen therapy and inhaled medicines.  Keep all  follow-up visits as told by your doctor. This is important. Contact a doctor if:  Your condition does not get better as soon as expected.  You have a hard time doing your normal activities, even after you rest.  You have new symptoms. Get help right away if:  Your shortness of breath gets worse.  You have trouble breathing when you are resting.  You feel light-headed or you pass out (faint).  You have a cough that is not helped by medicines.  You cough up blood.  You have pain with breathing.  You have pain in  your chest, arms, shoulders, or belly (abdomen).  You have a fever.  You cannot walk up stairs.  You cannot exercise the way you normally do. These symptoms may represent a serious problem that is an emergency. Do not wait to see if the symptoms will go away. Get medical help right away. Call your local emergency services (911 in the [place] Do not drive yourself to the hospital. Summary  Shortness of breath is when you have trouble breathing enough air. It can be a sign of a medical problem.  Avoid things that make it hard for you to breathe, such as smoking, pollution, mold, and dust.  Watch for any changes in your symptoms. Contact your doctor if you do not get better or you get worse. This information is not intended to replace advice given to you by your health care provider. Make sure you discuss any questions you have with your health care provider. Document Revised: 01/14/2018 Document Reviewed: 01/14/2018 Elsevier Patient Education  2020 [employer].

## 2020-01-15 NOTE — Progress Notes (Signed)
Patient ID: Angela Herman, female    DOB: 11-19-1965, 54 y.o.   MRN: [id number]  PCP: Delsa Grana, PA-C  Chief Complaint  Patient presents with  . Follow-up    abnormal labs    Subjective:   Angela Herman is a 54 y.o. female, presents to clinic with CC of the following:  HPI  Here for f/up on abnormal labs - decrease in renal function concerning for AKI to acute renal failure:  Patient noted continued fatigue, she states that she always has breathing problems and is short of breath she is on inhalers has a nebulizer machine, has been feeling slightly more wheezy but figured it was her allergies.  She denies orthopnea, PND, lower extremity edema, palpitations, dyspnea on exertion, near syncope, she denies any exertional symptoms at all.  Has not had a fever, sweats  Lab Results  Component Value Date   GFRNONAA 25 (L) 01/12/2020   GFRNONAA 82 06/13/2019    Lab Results  Component Value Date   CREATININE 2.19 (H) 01/12/2020   Lab Results  Component Value Date   BUN 10 01/12/2020   Lab Results  Component Value Date   GFRNONAA 25 (L) 01/12/2020   She is only taking suppplements - Mag calcium and Zinc B-12 complex Vit D3 No other OTC meds/supplements/herbs etc.  She was drinking 5 water bottles a day, still drinks tea and coffee, maybe 1-2 sodas a day - no change recently or prior to abnormal labs 2-3 days ago, she feels she was well hydrated the day of appt and the day prior. She denies any recent illness She is urinating normally BM ok Generalized fatigue -  No exertional Sx  Unclear etiology for AKI  Hx of HTN on lisinopril - has been for a long time, no hx of AKI, we did have her hold her meds since Thursday  BP Readings from Last 3 Encounters:  01/15/20 134/84  01/12/20 122/76  11/06/19 113/76   Pt denies CP, SOB, exertional sx, LE edema, palpitation, Ha's, visual disturbances   AKI (acute kidney injury) ([place]) [N17.9] - Primary   GFR <30, sCr more than  doubled compared to past labs - no sx, she is drinking and urinating normal, HTN well controlled, hold lisinopril, recheck labs    New onset DM- not on meds Discussed new onset DM - pt recently working on diet efforts and trying to be healthier. Will get her glucometer - discussed checking in the morning   Discussed lipid panel and starting meds other than fenofibrate- will address further after we recheck and ensure kidney function improves    Patient Active Problem List   Diagnosis Date Noted  . Dyslipidemia 06/13/2019  . Varicose veins of leg with pain, right 06/13/2019  . Bilateral lower extremity edema 06/13/2019  . Scoliosis of thoracic spine 06/13/2019  . Fibromyalgia 06/13/2019  . Asthma 06/13/2019  . Chronic obstructive pulmonary disease ([place]) 06/13/2019  . Prediabetes 06/13/2019  . Essential hypertension 06/13/2019  . Gastroesophageal reflux disease 06/13/2019  . Anxiety disorder 06/13/2019      Current Outpatient Medications:  .  albuterol (VENTOLIN HFA) 108 (90 Base) MCG/ACT inhaler, Inhale 2 puffs into the lungs every 4 (four) hours as needed for wheezing or shortness of breath., Disp: 18 g, Rfl: 3 .  baclofen (LIORESAL) 10 MG tablet, Take 1 tablet (10 mg total) by mouth 3 (three) times daily., Disp: 270 each, Rfl: 3 .  busPIRone (BUSPAR) 15 MG tablet, TAKE 1 TABLET (15  MG TOTAL) BY MOUTH 3 (THREE) TIMES DAILY., Disp: 270 tablet, Rfl: 1 .  diclofenac Sodium (VOLTAREN) 1 % GEL, APPLY 4 G TOPICALLY 2 (TWO) TIMES DAILY., Disp: 100 g, Rfl: 11 .  DULoxetine (CYMBALTA) 20 MG capsule, Take 2 capsules (40 mg total) by mouth daily., Disp: 180 capsule, Rfl: 3 .  fenofibrate micronized (LOFIBRA) 134 MG capsule, TAKE 1 CAPSULE (134 MG TOTAL) BY MOUTH DAILY BEFORE BREAKFAST., Disp: 90 capsule, Rfl: 1 .  Fluticasone-Salmeterol (ADVAIR) 250-50 MCG/DOSE AEPB, Inhale 1 puff into the lungs 2 (two) times daily., Disp: 60 each, Rfl: 11 .  gabapentin (NEURONTIN) 300 MG capsule, TAKE 1 CAP  EVERY AM ,1 AT NOON AND 3 AT BEDTIME, Disp: 450 capsule, Rfl: 1 .  ipratropium-albuterol (DUONEB) 0.5-2.5 (3) MG/3ML SOLN, Take 3 mLs by nebulization every 6 (six) hours as needed (for worse cough, SOB and wheeze for COPD and asthma exacerbation)., Disp: 180 mL, Rfl: 1 .  ondansetron (ZOFRAN) 8 MG tablet, TAKE 1 TABLET (8 MG TOTAL) BY MOUTH EVERY 8 (EIGHT) HOURS AS NEEDED FOR NAUSEA OR VOMITING., Disp: 20 tablet, Rfl: 1 .  pantoprazole (PROTONIX) 40 MG tablet, Take 1 tablet (40 mg total) by mouth daily., Disp: 90 tablet, Rfl: 1 .  Respiratory Therapy Supplies (NEBULIZER/TUBING/MOUTHPIECE) KIT, Disp one nebulizer machine, tubing set and mouthpiece kit, Disp: 1 kit, Rfl: 0 .  [START ON 01/17/2020] traMADol (ULTRAM) 50 MG tablet, Take 1 tablet (50 mg total) by mouth 3 (three) times daily as needed for severe pain (for chronic back pain)., Disp: 90 tablet, Rfl: 0 .  lisinopril (ZESTRIL) 20 MG tablet, Take 1 tablet (20 mg total) by mouth daily. (Patient not taking: Reported on 01/15/2020), Disp: 90 tablet, Rfl: 3 .  traMADol (ULTRAM) 50 MG tablet, Take 1 tablet (50 mg total) by mouth every 8 (eight) hours as needed for severe pain (chronic back pain)., Disp: 90 tablet, Rfl: 0 .  [START ON 02/17/2020] traMADol (ULTRAM) 50 MG tablet, Take 1 tablet (50 mg total) by mouth 3 (three) times daily as needed for severe pain (severe chronic back pain management)., Disp: 90 tablet, Rfl: 0   Allergies  Allergen Reactions  . Aspirin   . [place]   . Codeine      Family History  Adopted: Yes  Problem Relation Age of Onset  . Bipolar disorder Daughter   . Drug abuse Daughter   . Hypertension Son      Social History   Socioeconomic History  . Marital status: Married    Spouse name: jimmie  . Number of children: 3  . Years of education: 18  . Highest education level: Bachelor's degree (e.g., BA, AB, BS)  Occupational History  . Occupation: disability  Tobacco Use  . Smoking status: Former Smoker     Packs/day: 0.50    Years: 36.00    Pack years: 18.00    Types: Cigarettes    Quit date: 04/16/2019    Years since quitting: 0.7  . Smokeless tobacco: Never Used  Substance and Sexual Activity  . Alcohol use: Never  . Drug use: Not Currently    Comment: marijuana in the past  . Sexual activity: Not Currently    Comment: husband  Other Topics Concern  . Not on file  Social History Narrative  . Not on file   Social Determinants of Health   Financial Resource Strain: Low Risk   . Difficulty of Paying Living Expenses: Not hard at all  Food Insecurity: No Food Insecurity  .  Worried About Charity fundraiser in the Last Year: Never true  . Ran Out of Food in the Last Year: Never true  Transportation Needs: No Transportation Needs  . Lack of Transportation (Medical): No  . Lack of Transportation (Non-Medical): No  Physical Activity: Sufficiently Active  . Days of Exercise per Week: 7 days  . Minutes of Exercise per Session: 30 min  Stress: No Stress Concern Present  . Feeling of Stress : Only a little  Social Connections: Somewhat Isolated  . Frequency of Communication with Friends and Family: More than three times a week  . Frequency of Social Gatherings with Friends and Family: More than three times a week  . Attends Religious Services: Never  . Active Member of Clubs or Organizations: No  . Attends Archivist Meetings: Never  . Marital Status: Married  Human resources officer Violence: Not At Risk  . Fear of Current or Ex-Partner: No  . Emotionally Abused: No  . Physically Abused: No  . Sexually Abused: No    Chart Review Today: I personally reviewed active problem list, medication list, allergies, family history, social history, health maintenance, notes from last encounter, lab results, imaging with the patient/caregiver today.   Review of Systems 10 Systems reviewed and are negative for acute change except as noted in the HPI.     Objective:   Vitals:    01/15/20 1423  BP: 134/84  Pulse: 73  Resp: 14  Temp: 98.1 F (36.7 C)  SpO2: 96%  Weight: 215 lb 4.8 oz (97.7 kg)  Height: '5\' 7"'$  (1.702 m)    Body mass index is 33.72 kg/m.  Physical Exam Vitals and nursing note reviewed.  Constitutional:      General: She is not in acute distress.    Appearance: Normal appearance. She is well-developed. She is obese. She is not ill-appearing, toxic-appearing or diaphoretic.     Interventions: Face mask in place.  HENT:     Head: Normocephalic and atraumatic.     Right Ear: External ear normal.     Left Ear: External ear normal.     Nose: Congestion and rhinorrhea present.     Mouth/Throat:     Mouth: Mucous membranes are dry.     Pharynx: Oropharynx is clear. Posterior oropharyngeal erythema present.  Eyes:     General: Lids are normal. No scleral icterus.       Right eye: No discharge.        Left eye: No discharge.     Conjunctiva/sclera: Conjunctivae normal.  Neck:     Trachea: Phonation normal. No tracheal deviation.  Cardiovascular:     Rate and Rhythm: Normal rate and regular rhythm.     Pulses: Normal pulses.          Radial pulses are 2+ on the right side and 2+ on the left side.       Posterior tibial pulses are 2+ on the right side and 2+ on the left side.     Heart sounds: Normal heart sounds. No murmur. No friction rub. No gallop.   Pulmonary:     Effort: Pulmonary effort is normal. No respiratory distress.     Breath sounds: No stridor. Wheezing present. No rhonchi or rales.  Chest:     Chest wall: No tenderness.  Abdominal:     General: Bowel sounds are normal. There is no distension.     Palpations: Abdomen is soft.     Tenderness: There is no abdominal  tenderness. There is no guarding or rebound.  Musculoskeletal:     Right lower leg: No edema.     Left lower leg: No edema.  Lymphadenopathy:     Cervical: No cervical adenopathy.  Skin:    General: Skin is warm and dry.     Coloration: Skin is not jaundiced or  pale.     Findings: No rash.  Neurological:     Mental Status: She is alert. Mental status is at baseline.     Motor: No abnormal muscle tone.     Coordination: Coordination normal.     Gait: Gait normal.  Psychiatric:        Mood and Affect: Mood normal.        Speech: Speech normal.        Behavior: Behavior normal.     ECG interpretation   Date: 01/15/2020  Rate: 62  Rhythm: normal sinus rhythm  QRS Axis: normal  Intervals: normal  ST/T Wave abnormalities: Nonspecific ST and T wave abnormalities, no ST elevation or depression  Conduction Disutrbances: none  Old EKG Reviewed: No prior EKGs to compare to Consulted with Dr. Roxan Hockey with EKG prior to pt leaving today     01/15/20 1440  Vital Signs  Patient Position (if appropriate) Orthostatic Vitals  Orthostatic Lying   BP- Lying 132/84  Pulse- Lying 66  Orthostatic Sitting  BP- Sitting 130/86  Pulse- Sitting 66  Orthostatic Standing at 0 minutes  BP- Standing at 0 minutes 132/86  Pulse- Standing at 0 minutes 66  Orthostatics reviewed and negative  Results for orders placed or performed in visit on 01/12/20  CBC with Differential/Platelet  Result Value Ref Range   WBC 11.0 (H) 3.8 - 10.8 Thousand/uL   RBC 4.74 3.80 - 5.10 Million/uL   Hemoglobin 14.3 11.7 - 15.5 g/dL   HCT 42.9 35.0 - 45.0 %   MCV 90.5 80.0 - 100.0 fL   MCH 30.2 27.0 - 33.0 pg   MCHC 33.3 32.0 - 36.0 g/dL   RDW 12.2 11.0 - 15.0 %   Platelets 292 140 - 400 Thousand/uL   MPV 12.1 7.5 - 12.5 fL   Neutro Abs 6,424 1,500 - 7,800 cells/uL   Lymphs Abs 3,300 850 - 3,900 cells/uL   Absolute Monocytes 946 200 - 950 cells/uL   Eosinophils Absolute 264 15 - 500 cells/uL   Basophils Absolute 66 0 - 200 cells/uL   Neutrophils Relative % 58.4 %   Total Lymphocyte 30.0 %   Monocytes Relative 8.6 %   Eosinophils Relative 2.4 %   Basophils Relative 0.6 %  Lipid panel  Result Value Ref Range   Cholesterol 182 <200 mg/dL   HDL 37 (L) > OR = 50  mg/dL   Triglycerides 189 (H) <150 mg/dL   LDL Cholesterol (Calc) 115 (H) mg/dL (calc)   Total CHOL/HDL Ratio 4.9 <5.0 (calc)   Non-HDL Cholesterol (Calc) 145 (H) <130 mg/dL (calc)  COMPLETE METABOLIC PANEL WITH GFR  Result Value Ref Range   Glucose, Bld 117 (H) 65 - 99 mg/dL   BUN 10 7 - 25 mg/dL   Creat 2.19 (H) 0.50 - 1.05 mg/dL   GFR, Est Non African American 25 (L) > OR = 60 mL/min/1.80m   GFR, Est African American 28 (L) > OR = 60 mL/min/1.773m  BUN/Creatinine Ratio 5 (L) 6 - 22 (calc)   Sodium 137 135 - 146 mmol/L   Potassium 4.0 3.5 - 5.3 mmol/L   Chloride 103  98 - 110 mmol/L   CO2 27 20 - 32 mmol/L   Calcium 9.6 8.6 - 10.4 mg/dL   Total Protein 6.7 6.1 - 8.1 g/dL   Albumin 4.1 3.6 - 5.1 g/dL   Globulin 2.6 1.9 - 3.7 g/dL (calc)   AG Ratio 1.6 1.0 - 2.5 (calc)   Total Bilirubin 0.4 0.2 - 1.2 mg/dL   Alkaline phosphatase (APISO) 95 37 - 153 U/L   AST 25 10 - 35 U/L   ALT 33 (H) 6 - 29 U/L  Hemoglobin A1c  Result Value Ref Range   Hgb A1c MFr Bld 7.3 (H) <5.7 % of total Hgb   Mean Plasma Glucose 163 (calc)   eAG (mmol/L) 9.0 (calc)        Assessment & Plan:   1. Acute kidney injury ([place])  O37.8 COMPLETE METABOLIC PANEL WITH GFR    Urinalysis, Routine w reflex microscopic    Microalbumin / creatinine urine ratio   01/12/2020 routine OV GFR 25, previously 82 sCr 2.19, up from 0.81 - consistent with AKI, concern for acute decline in renal function or progression to renal failure, without any known cause, pt was asked to come back today for OV to discuss further and recheck labs pt has held lisinopril, continued to push hydration rechecking renal function today Patient does not appear to be dehydration orthostatics checked today were neg, VSS Patient appears euvolemic Does appear mildly ill today but 3 days ago with abnormal labs she appeared well Screen urine -patient states she is urinating normally without any symptoms concerning for UTI    2. Chronic  obstructive pulmonary disease with acute exacerbation (HCC)  J44.1 predniSONE (DELTASONE) 20 MG tablet    doxycycline (VIBRA-TABS) 100 MG tablet    DG Chest 2 View   wheeze on exam, new today, no wheeze with appt earlier in week, COPD/asthma exacerbation from allergies? VS URI? tx steroid burst, nebs CXR to r/o CAP - though no fever or sweats, pt complains of fatigue, wheeze, chest congestion and SOB More aggressive tx with abx due to fatigue and AKI   2. Fatigue, unspecified type  R53.83 EKG 12-Lead    DG Chest 2 View   cxr to ensure no pulmonary infection or concerning findings with AKI, fatigue and new wheeze today EKG done- no ST elevation or depression, does not have any other symptoms consistent with ACS though EKG was done to ensure no cardiac abnormalities and a female presenting with abnormal labs and generalized fatigue                   4. Rhinosinusitis  J31.0    J32.9    Congestion and erythema to nasal mucosa and erythematous oropharynx with chest congestion and wheeze, supportive tx for nasal sx   5. Wheeze  R06.2 DG Chest 2 View   see #1, considered cardiac wheeze?  - EKG done due to fatigue, non-specific t-wave abnormalities, CXR ordered- pt will due today, appear euvolemic-do not suspect any CHF Encouraged her to do breathing treatments and burst of steroids follow-up if not improving, reviewed ER precautions and symptoms patient verbalized understanding   6. New onset type 2 diabetes mellitus (HCC)  E11.9 glucose blood (GLUCOSE METER TEST) test strip    blood glucose meter kit and supplies KIT    Lancets MISC  Patient is labs from few days ago in addition to AKI also showed new onset diabetes with A1c, explained to the patient that her blood sugars will  be higher while on steroids but we did want to get her a meter set up so she can start monitoring her blood sugar few times a week.  Patient was encouraged to work on her diet, decreasing sugars and carbs, decreasing  portions and calories and when she is feeling better increasing exercise.  Need to do a close follow-up on new onset diabetes and on hyperlipidemia I would like to change her medications and I discussed this with her today would like to stop fenofibrate and start a statin and however would like to make sure that her renal function has improved and then we will address her other diagnoses labs and medication changes.  If and when renal function is improved - Metformin 500 mg -start Metformin 1 pill daily for 1 to 2 weeks until tolerating well then increase to 500 mg twice daily and slowly increase until she is tolerating 1000 mg twice daily-reviewed side effects and slow dose titration, encouraged her to call us if she is having trouble with tolerating meds.  7. Dyslipidemia  E78.5    Poorly controlled with last labs, patient has never tried a statin but is willing to try Once AKI is resolved will change meds Plan to D/C fenofibrate - start crestor 10 mg daily at bedtime   Greater than 50% of this visit was spent in direct face-to-face counseling, obtaining history and physical, discussing and educating pt on treatment plan.  Total time of this visit was 101 min+ .  Remainder of time involved but was not limited to reviewing chart (recent and pertinent OV notes and labs), documentation in EMR, and coordinating care and treatment plan.    Delsa Grana, PA-C 01/15/20 2:33 PM

## 2020-01-15 NOTE — Progress Notes (Deleted)
Patient noted continued fatigue, she states that she always has breathing problems and is short of breath she is on inhalers has a nebulizer machine, has been feeling slightly more wheezy but figured it was her allergies.  She denies orthopnea, PND, lower extremity edema, palpitations, dyspnea on exertion, near syncope, she denies any exertional symptoms at all.  Has not had a fever, sweats  EKG is sinus rhythm but very small ST waves and no prior EKG to compare to no ST elevation or depression but amplitude is very small.  Will get chest x-ray across the street today, gave ER precautions, will follow patient closely with labs and symptoms, 2-week follow-up

## 2020-01-15 NOTE — Progress Notes (Signed)
   01/15/20 1440  Vital Signs  Patient Position (if appropriate) Orthostatic Vitals  Orthostatic Lying   BP- Lying 132/84  Pulse- Lying 66  Orthostatic Sitting  BP- Sitting 130/86  Pulse- Sitting 66  Orthostatic Standing at 0 minutes  BP- Standing at 0 minutes 132/86  Pulse- Standing at 0 minutes 66

## 2020-01-16 ENCOUNTER — Other Ambulatory Visit: Payer: Self-pay | Admitting: Family Medicine

## 2020-01-16 DIAGNOSIS — E785 Hyperlipidemia, unspecified: Secondary | ICD-10-CM

## 2020-01-16 DIAGNOSIS — E119 Type 2 diabetes mellitus without complications: Secondary | ICD-10-CM

## 2020-01-16 LAB — COMPLETE METABOLIC PANEL WITH GFR
AG Ratio: 1.7 (calc) (ref 1.0–2.5)
ALT: 32 U/L — ABNORMAL HIGH (ref 6–29)
AST: 25 U/L (ref 10–35)
Albumin: 4.1 g/dL (ref 3.6–5.1)
Alkaline phosphatase (APISO): 92 U/L (ref 37–153)
BUN: 12 mg/dL (ref 7–25)
CO2: 26 mmol/L (ref 20–32)
Calcium: 9.4 mg/dL (ref 8.6–10.4)
Chloride: 104 mmol/L (ref 98–110)
Creat: 0.79 mg/dL (ref 0.50–1.05)
GFR, Est African American: 98 mL/min/{1.73_m2} (ref >=60)
GFR, Est Non African American: 84 mL/min/{1.73_m2} (ref >=60)
Globulin: 2.4 g/dL (calc) (ref 1.9–3.7)
Glucose, Bld: 167 mg/dL — ABNORMAL HIGH (ref 65–99)
Potassium: 4.8 mmol/L (ref 3.5–5.3)
Sodium: 138 mmol/L (ref 135–146)
Total Bilirubin: 0.2 mg/dL (ref 0.2–1.2)
Total Protein: 6.5 g/dL (ref 6.1–8.1)

## 2020-01-16 LAB — URINALYSIS, ROUTINE W REFLEX MICROSCOPIC
Bilirubin Urine: NEGATIVE
Glucose, UA: NEGATIVE
Hgb urine dipstick: NEGATIVE
Ketones, ur: NEGATIVE
Leukocytes,Ua: NEGATIVE
Nitrite: NEGATIVE
Protein, ur: NEGATIVE
Specific Gravity, Urine: 1.021 (ref 1.001–1.03)
pH: 5.5 (ref 5.0–8.0)

## 2020-01-16 LAB — MICROALBUMIN / CREATININE URINE RATIO
Creatinine, Urine: 109 mg/dL (ref 20–275)
Microalb Creat Ratio: 9 mcg/mg creat (ref <30)
Microalb, Ur: 1 mg/dL

## 2020-01-16 MED ORDER — ROSUVASTATIN CALCIUM 10 MG PO TABS: 10.0000 mg | ORAL_TABLET | Freq: Every day | ORAL | 3 refills | Status: DC

## 2020-01-16 MED ORDER — METFORMIN HCL 500 MG PO TABS: 500.0000 mg | ORAL_TABLET | Freq: Two times a day (BID) | ORAL | 3 refills | Status: DC

## 2020-01-19 ENCOUNTER — Encounter: Payer: Self-pay | Admitting: Family Medicine

## 2020-01-27 ENCOUNTER — Encounter: Payer: Self-pay | Admitting: Family Medicine

## 2020-01-27 DIAGNOSIS — E1169 Type 2 diabetes mellitus with other specified complication: Secondary | ICD-10-CM | POA: Insufficient documentation

## 2020-02-03 ENCOUNTER — Other Ambulatory Visit: Payer: Self-pay

## 2020-02-03 DIAGNOSIS — E119 Type 2 diabetes mellitus without complications: Secondary | ICD-10-CM

## 2020-02-03 MED ORDER — ONETOUCH VERIO REFLECT W/DEVICE KIT
1.0000 | PACK | Freq: Every day | 0 refills | Status: DC
Start: 2020-02-03 — End: 2020-04-20

## 2020-02-03 MED ORDER — ONETOUCH ULTRASOFT LANCETS MISC
12 refills | Status: DC
Start: 2020-02-03 — End: 2020-04-20

## 2020-02-03 MED ORDER — ONETOUCH VERIO VI STRP: ORAL_STRIP | 12 refills | Status: DC

## 2020-02-05 ENCOUNTER — Encounter: Payer: Self-pay | Admitting: Family Medicine

## 2020-02-06 ENCOUNTER — Other Ambulatory Visit: Payer: Self-pay

## 2020-02-06 DIAGNOSIS — E119 Type 2 diabetes mellitus without complications: Secondary | ICD-10-CM

## 2020-02-06 MED ORDER — LANCETS MISC: 1.0000 "application " | Freq: Every day | 3 refills | Status: DC

## 2020-02-06 MED ORDER — ONETOUCH VERIO VI STRP: ORAL_STRIP | 12 refills | Status: DC

## 2020-02-10 ENCOUNTER — Encounter: Payer: Self-pay | Admitting: Family Medicine

## 2020-02-10 DIAGNOSIS — E119 Type 2 diabetes mellitus without complications: Secondary | ICD-10-CM

## 2020-02-10 MED ORDER — LANCETS MISC: 1.0000 "application " | Freq: Every day | 3 refills | Status: DC

## 2020-02-10 MED ORDER — ONETOUCH VERIO VI STRP: ORAL_STRIP | 12 refills | Status: DC

## 2020-02-17 ENCOUNTER — Other Ambulatory Visit: Payer: Self-pay | Admitting: Family Medicine

## 2020-02-17 ENCOUNTER — Encounter: Payer: Self-pay | Admitting: Family Medicine

## 2020-02-18 ENCOUNTER — Other Ambulatory Visit: Payer: Self-pay | Admitting: Family Medicine

## 2020-03-04 ENCOUNTER — Other Ambulatory Visit: Payer: Self-pay | Admitting: Family Medicine

## 2020-03-17 ENCOUNTER — Encounter: Payer: Self-pay | Admitting: Family Medicine

## 2020-03-18 ENCOUNTER — Other Ambulatory Visit: Payer: Self-pay

## 2020-03-18 ENCOUNTER — Other Ambulatory Visit: Payer: Self-pay | Admitting: Family Medicine

## 2020-03-18 DIAGNOSIS — M419 Scoliosis, unspecified: Secondary | ICD-10-CM

## 2020-03-18 DIAGNOSIS — J449 Chronic obstructive pulmonary disease, unspecified: Secondary | ICD-10-CM

## 2020-03-18 DIAGNOSIS — M797 Fibromyalgia: Secondary | ICD-10-CM

## 2020-03-18 DIAGNOSIS — E785 Hyperlipidemia, unspecified: Secondary | ICD-10-CM

## 2020-03-18 DIAGNOSIS — Z5181 Encounter for therapeutic drug level monitoring: Secondary | ICD-10-CM

## 2020-03-18 DIAGNOSIS — G894 Chronic pain syndrome: Secondary | ICD-10-CM

## 2020-03-18 DIAGNOSIS — E119 Type 2 diabetes mellitus without complications: Secondary | ICD-10-CM

## 2020-03-18 DIAGNOSIS — I10 Essential (primary) hypertension: Secondary | ICD-10-CM

## 2020-03-18 MED ORDER — TRAMADOL HCL 50 MG PO TABS: 50.0000 mg | ORAL_TABLET | Freq: Three times a day (TID) | ORAL | 0 refills | Status: DC | PRN

## 2020-03-18 NOTE — Telephone Encounter (Signed)
I have sent in.  

## 2020-03-18 NOTE — Progress Notes (Signed)
Med refill on tramadol which manages chronic back pain in addition to cymbalta, voltaren gel, baclofen and gabapentin. Pt compliant.   PDMP reviewed, no concerns or red flags. Pt has controlled substance contract signed as of Nov 2020  Refill prescribed

## 2020-04-19 ENCOUNTER — Ambulatory Visit: Payer: Medicare Other | Admitting: Family Medicine

## 2020-04-19 NOTE — Progress Notes (Deleted)
Name: Angela Herman   MRN: [id number]    DOB: 09-Jun-1966   Date:04/19/2020       Progress Note  No chief complaint on file.    Subjective:   Angela Herman is a 54 y.o. female, presents to clinic for Diabetes.   DM:   Pt managing DM with *** Reports *** med compliance Pt has *** SE from meds. Blood sugars *** Denies: Polyuria, polydipsia, vision changes, neuropathy, hypoglycemia Recent pertinent labs: Lab Results  Component Value Date   HGBA1C 7.3 (H) 01/12/2020   HGBA1C 6.2 (H) 06/13/2019   Standard of care and health maintenance: Urine Microalbumin:  *** Foot exam:  *** DM eye exam:  *** ACEI/ARB:  *** Statin:  ***   Current Outpatient Medications:  .  albuterol (VENTOLIN HFA) 108 (90 Base) MCG/ACT inhaler, Inhale 2 puffs into the lungs every 4 (four) hours as needed for wheezing or shortness of breath., Disp: 18 g, Rfl: 3 .  baclofen (LIORESAL) 10 MG tablet, Take 1 tablet (10 mg total) by mouth 3 (three) times daily., Disp: 270 each, Rfl: 3 .  blood glucose meter kit and supplies KIT, Dispense based on patient and insurance preference. E11.9, LON:99, Disp: 1 each, Rfl: 0 .  Blood Glucose Monitoring Suppl (ONETOUCH VERIO REFLECT) w/Device KIT, 1 applicator by Does not apply route daily. Dx:E11.9, LON:99, Disp: 1 kit, Rfl: 0 .  busPIRone (BUSPAR) 15 MG tablet, TAKE 1 TABLET (15 MG TOTAL) BY MOUTH 3 (THREE) TIMES DAILY., Disp: 270 tablet, Rfl: 1 .  diclofenac Sodium (VOLTAREN) 1 % GEL, APPLY 4 G TOPICALLY 2 (TWO) TIMES DAILY., Disp: 100 g, Rfl: 11 .  DULoxetine (CYMBALTA) 20 MG capsule, Take 2 capsules (40 mg total) by mouth daily., Disp: 180 capsule, Rfl: 3 .  Fluticasone-Salmeterol (ADVAIR) 250-50 MCG/DOSE AEPB, Inhale 1 puff into the lungs 2 (two) times daily., Disp: 60 each, Rfl: 11 .  gabapentin (NEURONTIN) 300 MG capsule, TAKE 1 CAP EVERY AM ,1 AT NOON AND 3 AT BEDTIME, Disp: 450 capsule, Rfl: 1 .  glucose blood (GLUCOSE METER TEST) test strip, Use as instructed E11.9,  Lon:99, Disp: 100 each, Rfl: 3 .  glucose blood (ONETOUCH VERIO) test strip, Check BG qd  dx;E11.9, LON:99, Disp: 100 each, Rfl: 12 .  ipratropium-albuterol (DUONEB) 0.5-2.5 (3) MG/3ML SOLN, Take 3 mLs by nebulization every 6 (six) hours as needed (for worse cough, SOB and wheeze for COPD and asthma exacerbation)., Disp: 180 mL, Rfl: 1 .  Lancets (ONETOUCH ULTRASOFT) lancets, Use as instructed Dx:E11.9, LON:99, Disp: 100 each, Rfl: 12 .  Lancets MISC, 1 application by Does not apply route daily., Disp: 100 each, Rfl: 3 .  lisinopril (ZESTRIL) 20 MG tablet, Take 1 tablet (20 mg total) by mouth daily. (Patient not taking: Reported on 01/15/2020), Disp: 90 tablet, Rfl: 3 .  metFORMIN (GLUCOPHAGE) 500 MG tablet, Take 1 tablet (500 mg total) by mouth 2 (two) times daily with a meal., Disp: 180 tablet, Rfl: 3 .  ondansetron (ZOFRAN) 8 MG tablet, TAKE 1 TABLET (8 MG TOTAL) BY MOUTH EVERY 8 (EIGHT) HOURS AS NEEDED FOR NAUSEA OR VOMITING., Disp: 20 tablet, Rfl: 1 .  pantoprazole (PROTONIX) 40 MG tablet, Take 1 tablet (40 mg total) by mouth daily., Disp: 90 tablet, Rfl: 1 .  Respiratory Therapy Supplies (NEBULIZER/TUBING/MOUTHPIECE) KIT, Disp one nebulizer machine, tubing set and mouthpiece kit, Disp: 1 kit, Rfl: 0 .  rosuvastatin (CRESTOR) 10 MG tablet, Take 1 tablet (10 mg total) by mouth at bedtime.,  Disp: 90 tablet, Rfl: 3 .  traMADol (ULTRAM) 50 MG tablet, Take 1 tablet (50 mg total) by mouth every 8 (eight) hours as needed for severe pain (chronic back pain)., Disp: 90 tablet, Rfl: 0  Patient Active Problem List   Diagnosis Date Noted  . New onset type 2 diabetes mellitus ([place]) 01/27/2020  . Chronic pain syndrome 01/15/2020  . Dyslipidemia 06/13/2019  . Varicose veins of leg with pain, right 06/13/2019  . Bilateral lower extremity edema 06/13/2019  . Scoliosis of thoracic spine 06/13/2019  . Fibromyalgia 06/13/2019  . Asthma 06/13/2019  . Chronic obstructive pulmonary disease ([place]) 06/13/2019  .  Prediabetes 06/13/2019  . Essential hypertension 06/13/2019  . Gastroesophageal reflux disease 06/13/2019  . Anxiety disorder 06/13/2019    Past Surgical History:  Procedure Laterality Date  . ABDOMINAL HYSTERECTOMY  2009  . CATARACT EXTRACTION    . CHOLECYSTECTOMY  2012  . CORNEA LACERATION REPAIR    . TUBAL LIGATION  1988    Family History  Adopted: Yes  Problem Relation Age of Onset  . Bipolar disorder Daughter   . Drug abuse Daughter   . Hypertension Son     Social History   Tobacco Use  . Smoking status: Former Smoker    Packs/day: 0.50    Years: 36.00    Pack years: 18.00    Types: Cigarettes    Quit date: 04/16/2019    Years since quitting: 1.0  . Smokeless tobacco: Never Used  Vaping Use  . Vaping Use: Former  . Substances: Nicotine  Substance Use Topics  . Alcohol use: Never  . Drug use: Not Currently    Comment: marijuana in the past     Allergies  Allergen Reactions  . Aspirin   . [place]   . Codeine     Health Maintenance  Topic Date Due  . Hepatitis C Screening  Never done  . PNEUMOCOCCAL POLYSACCHARIDE VACCINE AGE 41-64 HIGH RISK  Never done  . FOOT EXAM  Never done  . OPHTHALMOLOGY EXAM  Never done  . PAP SMEAR-Modifier  Never done  . MAMMOGRAM  Never done  . INFLUENZA VACCINE  03/28/2020  . TETANUS/TDAP  08/28/2020 (Originally 12/06/1983)  . COLONOSCOPY  01/14/2021 (Originally 05/25/2019)  . HIV Screening  01/14/2021 (Originally 12/06/1979)  . HEMOGLOBIN A1C  07/14/2020  . COVID-19 Vaccine  Completed    Chart Review Today: ***  Review of Systems   Objective:   There were no vitals filed for this visit.  There is no height or weight on file to calculate BMI.  Physical Exam      Assessment & Plan:   ***  No follow-ups on file.   Lennie Muckle, [facility] 04/19/20 12:27 PM

## 2020-04-20 ENCOUNTER — Ambulatory Visit (INDEPENDENT_AMBULATORY_CARE_PROVIDER_SITE_OTHER): Payer: Medicare Other | Admitting: Family Medicine

## 2020-04-20 ENCOUNTER — Encounter: Payer: Self-pay | Admitting: Family Medicine

## 2020-04-20 ENCOUNTER — Other Ambulatory Visit: Payer: Self-pay

## 2020-04-20 VITALS — BP 126/84 | HR 97 | Temp 98.1°F | Resp 18 | Ht 67.0 in | Wt 211.2 lb

## 2020-04-20 DIAGNOSIS — G894 Chronic pain syndrome: Secondary | ICD-10-CM

## 2020-04-20 DIAGNOSIS — Z23 Encounter for immunization: Secondary | ICD-10-CM

## 2020-04-20 DIAGNOSIS — Z5181 Encounter for therapeutic drug level monitoring: Secondary | ICD-10-CM

## 2020-04-20 DIAGNOSIS — I10 Essential (primary) hypertension: Secondary | ICD-10-CM

## 2020-04-20 DIAGNOSIS — E119 Type 2 diabetes mellitus without complications: Secondary | ICD-10-CM | POA: Diagnosis not present

## 2020-04-20 DIAGNOSIS — E785 Hyperlipidemia, unspecified: Secondary | ICD-10-CM

## 2020-04-20 DIAGNOSIS — M797 Fibromyalgia: Secondary | ICD-10-CM

## 2020-04-20 MED ORDER — TRAMADOL HCL 50 MG PO TABS: 50.0000 mg | ORAL_TABLET | Freq: Three times a day (TID) | ORAL | 0 refills | Status: DC | PRN

## 2020-04-20 MED ORDER — LISINOPRIL 20 MG PO TABS: 20.0000 mg | ORAL_TABLET | Freq: Every day | ORAL | 3 refills | Status: DC

## 2020-04-20 NOTE — Patient Instructions (Signed)
Diabetes Mellitus and Standards of Medical Care Managing diabetes (diabetes mellitus) can be complicated. Your diabetes treatment may be managed by a team of health care providers, including:  A physician who specializes in diabetes (endocrinologist).  A nurse practitioner or physician assistant.  Nurses.  A diet and nutrition specialist (registered dietitian).  A certified diabetes educator (CDE).  An exercise specialist.  A pharmacist.  An eye doctor.  A foot specialist (podiatrist).  A dentist.  A primary care provider.  A mental health provider. Your health care providers follow guidelines to help you get the best quality of care. The following schedule is a general guideline for your diabetes management plan. Your health care providers may give you more specific instructions. Physical exams Upon being diagnosed with diabetes mellitus, and each year after that, your health care provider will ask about your medical and family history. He or she will also do a physical exam. Your exam may include:  Measuring your height, weight, and body mass index (BMI).  Checking your blood pressure. This will be done at every routine medical visit. Your target blood pressure may vary depending on your medical conditions, your age, and other factors.  Thyroid gland exam.  Skin exam.  Screening for damage to your nerves (peripheral neuropathy). This may include checking the pulse in your legs and feet and checking the level of sensation in your hands and feet.  A complete foot exam to inspect the structure and skin of your feet, including checking for cuts, bruises, redness, blisters, sores, or other problems.  Screening for blood vessel (vascular) problems, which may include checking the pulse in your legs and feet and checking your temperature. Blood tests Depending on your treatment plan and your personal needs, you may have the following tests done:  HbA1c (hemoglobin A1c). This  test provides information about blood sugar (glucose) control over the previous 2-3 months. It is used to adjust your treatment plan, if needed. This test will be done: ? At least 2 times a year, if you are meeting your treatment goals. ? 4 times a year, if you are not meeting your treatment goals or if treatment goals have changed.  Lipid testing, including total, LDL, and HDL cholesterol and triglyceride levels. ? The goal for LDL is less than 100 mg/dL (5.5 mmol/L). If you are at high risk for complications, the goal is less than 70 mg/dL (3.9 mmol/L). ? The goal for HDL is 40 mg/dL (2.2 mmol/L) or higher for men and 50 mg/dL (2.8 mmol/L) or higher for women. An HDL cholesterol of 60 mg/dL (3.3 mmol/L) or higher gives some protection against heart disease. ? The goal for triglycerides is less than 150 mg/dL (8.3 mmol/L).  Liver function tests.  Kidney function tests.  Thyroid function tests. Dental and eye exams  Visit your dentist two times a year.  If you have type 1 diabetes, your health care provider may recommend an eye exam 3-5 years after you are diagnosed, and then once a year after your first exam. ? For children with type 1 diabetes, a health care provider may recommend an eye exam when your child is age 10 or older and has had diabetes for 3-5 years. After the first exam, your child should get an eye exam once a year.  If you have type 2 diabetes, your health care provider may recommend an eye exam as soon as you are diagnosed, and then once a year after your first exam. Immunizations   The   yearly flu (influenza) vaccine is recommended for everyone 6 months or older who has diabetes.  The pneumonia (pneumococcal) vaccine is recommended for everyone 2 years or older who has diabetes. If you are 65 or older, you may get the pneumonia vaccine as a series of two separate shots.  The hepatitis B vaccine is recommended for adults shortly after being diagnosed with  diabetes.  Adults and children with diabetes should receive all other vaccines according to age-specific recommendations from the Centers for Disease Control and Prevention (CDC). Mental and emotional health Screening for symptoms of eating disorders, anxiety, and depression is recommended at the time of diagnosis and afterward as needed. If your screening shows that you have symptoms (positive screening result), you may need more evaluation and you may work with a mental health care provider. Treatment plan Your treatment plan will be reviewed at every medical visit. You and your health care provider will discuss:  How you are taking your medicines, including insulin.  Any side effects you are experiencing.  Your blood glucose target goals.  The frequency of your blood glucose monitoring.  Lifestyle habits, such as activity level as well as tobacco, alcohol, and substance use. Diabetes self-management education Your health care provider will assess how well you are monitoring your blood glucose levels and whether you are taking your insulin correctly. He or she may refer you to:  A certified diabetes educator to manage your diabetes throughout your life, starting at diagnosis.  A registered dietitian who can create or review your personal nutrition plan.  An exercise specialist who can discuss your activity level and exercise plan. Summary  Managing diabetes (diabetes mellitus) can be complicated. Your diabetes treatment may be managed by a team of health care providers.  Your health care providers follow guidelines in order to help you get the best quality of care.  Standards of care including having regular physical exams, blood tests, blood pressure monitoring, immunizations, screening tests, and education about how to manage your diabetes.  Your health care providers may also give you more specific instructions based on your individual health. This information is not intended  to replace advice given to you by your health care provider. Make sure you discuss any questions you have with your health care provider. Document Revised: 05/03/2018 Document Reviewed: 05/12/2016 Elsevier Patient Education  2020 Elsevier Inc.  

## 2020-04-20 NOTE — Progress Notes (Signed)
Name: Angela Herman   MRN: [id number]    DOB: Jul 26, 1966   Date:04/20/2020       Progress Note  Chief Complaint  Patient presents with  . Diabetes    follow up  . Back Pain    follow up, medication refill     Subjective:   Angela Herman is a 54 y.o. female, presents to clinic for routine f/up and first f/up on DM since dx and starting meds    DM:   Pt managing DM with Metformin 500 mg BID - some GI upset when taken w/o food - tolerating when taking with food Reports good  med compliance Blood sugars 120-125, when its 100-120 she feels jittery, fasting cbg was 140-150 a lot, working on eating better Denies: Polyuria, polydipsia, vision changes, neuropathy, hypoglycemia Recent pertinent labs: Lab Results  Component Value Date   HGBA1C 7.3 (H) 01/12/2020   HGBA1C 6.2 (H) 06/13/2019   Standard of care and health maintenance: Urine Microalbumin:  done Foot exam:  Due today DM eye exam: due ACEI/ARB:  Lisinopril 20 mg  Statin:  crestor 10  Hypertension:  Currently managed on lisinopril 20 mg daily Pt reports good med compliance and denies any SE.   Blood pressure today is well controlled. BP Readings from Last 3 Encounters:  04/20/20 126/84  01/15/20 134/84  01/12/20 122/76  Pt denies CP, SOB, exertional sx, LE edema, palpitation, Ha's, visual disturbances, lightheadedness, hypotension, syncope.   Hyperlipidemia: Currently treated with crestor $RemoveBefo'10mg'QCeAoEUoZhU$  , pt reports good med compliance- just started med three months ago, tolerating- makes her a little tired - she wasn't taking at bedtime, but husband noticed it said to take at bedtime then it was better tolerated Last Lipids: Lab Results  Component Value Date   CHOL 182 01/12/2020   HDL 37 (L) 01/12/2020   LDLCALC 115 (H) 01/12/2020   TRIG 189 (H) 01/12/2020   CHOLHDL 4.9 01/12/2020   - Denies: Chest pain, shortness of breath, myalgias, claudication  Chronic back pain/fibromyalgia - here for tramadol refill as  well PDMP reviewed, due for meds, no concerning findings   Current Outpatient Medications:  .  albuterol (VENTOLIN HFA) 108 (90 Base) MCG/ACT inhaler, Inhale 2 puffs into the lungs every 4 (four) hours as needed for wheezing or shortness of breath., Disp: 18 g, Rfl: 3 .  baclofen (LIORESAL) 10 MG tablet, Take 1 tablet (10 mg total) by mouth 3 (three) times daily., Disp: 270 each, Rfl: 3 .  busPIRone (BUSPAR) 15 MG tablet, TAKE 1 TABLET (15 MG TOTAL) BY MOUTH 3 (THREE) TIMES DAILY., Disp: 270 tablet, Rfl: 1 .  diclofenac Sodium (VOLTAREN) 1 % GEL, APPLY 4 G TOPICALLY 2 (TWO) TIMES DAILY., Disp: 100 g, Rfl: 11 .  DULoxetine (CYMBALTA) 20 MG capsule, Take 2 capsules (40 mg total) by mouth daily., Disp: 180 capsule, Rfl: 3 .  Fluticasone-Salmeterol (ADVAIR) 250-50 MCG/DOSE AEPB, Inhale 1 puff into the lungs 2 (two) times daily., Disp: 60 each, Rfl: 11 .  gabapentin (NEURONTIN) 300 MG capsule, TAKE 1 CAP EVERY AM ,1 AT NOON AND 3 AT BEDTIME, Disp: 450 capsule, Rfl: 1 .  ipratropium-albuterol (DUONEB) 0.5-2.5 (3) MG/3ML SOLN, Take 3 mLs by nebulization every 6 (six) hours as needed (for worse cough, SOB and wheeze for COPD and asthma exacerbation)., Disp: 180 mL, Rfl: 1 .  metFORMIN (GLUCOPHAGE) 500 MG tablet, Take 1 tablet (500 mg total) by mouth 2 (two) times daily with a meal., Disp: 180 tablet, Rfl: 3 .  ondansetron (ZOFRAN) 8 MG tablet, TAKE 1 TABLET (8 MG TOTAL) BY MOUTH EVERY 8 (EIGHT) HOURS AS NEEDED FOR NAUSEA OR VOMITING., Disp: 20 tablet, Rfl: 1 .  pantoprazole (PROTONIX) 40 MG tablet, Take 1 tablet (40 mg total) by mouth daily., Disp: 90 tablet, Rfl: 1 .  rosuvastatin (CRESTOR) 10 MG tablet, Take 1 tablet (10 mg total) by mouth at bedtime., Disp: 90 tablet, Rfl: 3 .  traMADol (ULTRAM) 50 MG tablet, Take 1 tablet (50 mg total) by mouth every 8 (eight) hours as needed for severe pain (chronic back pain)., Disp: 90 tablet, Rfl: 0 .  blood glucose meter kit and supplies KIT, Dispense based on  patient and insurance preference. E11.9, LON:99 (Patient not taking: Reported on 04/20/2020), Disp: 1 each, Rfl: 0 .  Blood Glucose Monitoring Suppl (ONETOUCH VERIO REFLECT) w/Device KIT, 1 applicator by Does not apply route daily. Dx:E11.9, LON:99 (Patient not taking: Reported on 04/20/2020), Disp: 1 kit, Rfl: 0 .  glucose blood (GLUCOSE METER TEST) test strip, Use as instructed E11.9, Lon:99 (Patient not taking: Reported on 04/20/2020), Disp: 100 each, Rfl: 3 .  glucose blood (ONETOUCH VERIO) test strip, Check BG qd  dx;E11.9, LON:99 (Patient not taking: Reported on 04/20/2020), Disp: 100 each, Rfl: 12 .  Lancets (ONETOUCH ULTRASOFT) lancets, Use as instructed Dx:E11.9, LON:99 (Patient not taking: Reported on 04/20/2020), Disp: 100 each, Rfl: 12 .  Lancets MISC, 1 application by Does not apply route daily. (Patient not taking: Reported on 04/20/2020), Disp: 100 each, Rfl: 3 .  lisinopril (ZESTRIL) 20 MG tablet, Take 1 tablet (20 mg total) by mouth daily. (Patient not taking: Reported on 01/15/2020), Disp: 90 tablet, Rfl: 3 .  Respiratory Therapy Supplies (NEBULIZER/TUBING/MOUTHPIECE) KIT, Disp one nebulizer machine, tubing set and mouthpiece kit (Patient not taking: Reported on 04/20/2020), Disp: 1 kit, Rfl: 0  Patient Active Problem List   Diagnosis Date Noted  . New onset type 2 diabetes mellitus ([place]) 01/27/2020  . Chronic pain syndrome 01/15/2020  . Dyslipidemia 06/13/2019  . Varicose veins of leg with pain, right 06/13/2019  . Bilateral lower extremity edema 06/13/2019  . Scoliosis of thoracic spine 06/13/2019  . Fibromyalgia 06/13/2019  . Asthma 06/13/2019  . Chronic obstructive pulmonary disease ([place]) 06/13/2019  . Prediabetes 06/13/2019  . Essential hypertension 06/13/2019  . Gastroesophageal reflux disease 06/13/2019  . Anxiety disorder 06/13/2019    Past Surgical History:  Procedure Laterality Date  . ABDOMINAL HYSTERECTOMY  2009  . CATARACT EXTRACTION    . CHOLECYSTECTOMY  2012  .  CORNEA LACERATION REPAIR    . TUBAL LIGATION  1988    Family History  Adopted: Yes  Problem Relation Age of Onset  . Bipolar disorder Daughter   . Drug abuse Daughter   . Hypertension Son     Social History   Tobacco Use  . Smoking status: Former Smoker    Packs/day: 0.50    Years: 36.00    Pack years: 18.00    Types: Cigarettes    Quit date: 04/16/2019    Years since quitting: 1.0  . Smokeless tobacco: Never Used  Vaping Use  . Vaping Use: Former  . Substances: Nicotine  Substance Use Topics  . Alcohol use: Never  . Drug use: Not Currently    Comment: marijuana in the past     Allergies  Allergen Reactions  . Aspirin   . [place]   . Codeine     Health Maintenance  Topic Date Due  . Hepatitis C Screening  Never done  . PNEUMOCOCCAL POLYSACCHARIDE VACCINE AGE 20-64 HIGH RISK  Never done  . FOOT EXAM  Never done  . OPHTHALMOLOGY EXAM  Never done  . MAMMOGRAM  Never done  . TETANUS/TDAP  08/28/2020 (Originally 12/06/1983)  . COLONOSCOPY  01/14/2021 (Originally 05/25/2019)  . HIV Screening  01/14/2021 (Originally 12/06/1979)  . HEMOGLOBIN A1C  07/14/2020  . INFLUENZA VACCINE  Completed  . COVID-19 Vaccine  Completed  . PAP SMEAR-Modifier  Discontinued    Chart Review Today: I personally reviewed active problem list, medication list, allergies, family history, social history, health maintenance, notes from last encounter, lab results, imaging with the patient/caregiver today.   Review of Systems  10 Systems reviewed and are negative for acute change except as noted in the HPI.  Objective:   Vitals:   04/20/20 1417  BP: 126/84  Pulse: 97  Resp: 18  Temp: 98.1 F (36.7 C)  TempSrc: Oral  SpO2: 97%  Weight: 211 lb 3.2 oz (95.8 kg)  Height: $Remove'5\' 7"'MqWIwas$  (1.702 m)    Body mass index is 33.08 kg/m.  Physical Exam Vitals and nursing note reviewed.  Constitutional:      General: She is not in acute distress.    Appearance: Normal appearance. She is  well-developed. She is obese. She is not ill-appearing, toxic-appearing or diaphoretic.     Interventions: Face mask in place.  HENT:     Head: Normocephalic and atraumatic.     Right Ear: External ear normal.     Left Ear: External ear normal.  Eyes:     General: Lids are normal. No scleral icterus.       Right eye: No discharge.        Left eye: No discharge.     Conjunctiva/sclera: Conjunctivae normal.  Neck:     Trachea: Phonation normal. No tracheal deviation.  Cardiovascular:     Rate and Rhythm: Normal rate and regular rhythm.     Pulses: Normal pulses.          Radial pulses are 2+ on the right side and 2+ on the left side.       Posterior tibial pulses are 2+ on the right side and 2+ on the left side.     Heart sounds: Normal heart sounds. No murmur heard.  No friction rub. No gallop.   Pulmonary:     Effort: Pulmonary effort is normal. No respiratory distress.     Breath sounds: Normal breath sounds. No stridor. No wheezing, rhonchi or rales.  Chest:     Chest wall: No tenderness.  Abdominal:     General: Bowel sounds are normal. There is no distension.     Palpations: Abdomen is soft.  Musculoskeletal:     Right lower leg: No edema.     Left lower leg: No edema.  Skin:    General: Skin is warm and dry.     Coloration: Skin is not jaundiced or pale.     Findings: No rash.  Neurological:     Mental Status: She is alert. Mental status is at baseline.     Motor: No abnormal muscle tone.  Psychiatric:        Mood and Affect: Mood normal.        Speech: Speech normal.        Behavior: Behavior normal.        Diabetic Foot Exam - Simple   Simple Foot Form Diabetic Foot exam was performed with the following findings: Yes  04/20/2020  2:30 PM  Visual Inspection Sensation Testing Pulse Check Comments       Assessment & Plan:   1. Type 2 diabetes mellitus without complication, without long-term current use of insulin ([place]) Foot exam done today Pt needs eye  exam done Encouraged her to increase metformin dose if able, if trouble taking can take 1000 mg once daily with food. Recheck labs Discussed standards of care, blood sugar goals She has been working on diet  - Pneumococcal polysaccharide vaccine 23-valent greater than or equal to 2yo subcutaneous/IM - COMPLETE METABOLIC PANEL WITH GFR - Lipid panel - Hemoglobin A1c - lisinopril (ZESTRIL) 20 MG tablet; Take 1 tablet (20 mg total) by mouth daily.  Dispense: 90 tablet; Refill: 3  2. Essential hypertension Stable, well controlled BP today, lisinopril was previously held due to AKI - she has resumed without any sx or concerns, recheck labs today - COMPLETE METABOLIC PANEL WITH GFR - lisinopril (ZESTRIL) 20 MG tablet; Take 1 tablet (20 mg total) by mouth daily.  Dispense: 90 tablet; Refill: 3  3. Dyslipidemia Compliant with statin, just started a few months ago, tends to make her a little tired- encouraged taking at bedtime.  Continue statin and diet/lifestyle efforts Lab Results  Component Value Date   CHOL 182 01/12/2020   HDL 37 (L) 01/12/2020   LDLCALC 115 (H) 01/12/2020   TRIG 189 (H) 01/12/2020   CHOLHDL 4.9 01/12/2020   - COMPLETE METABOLIC PANEL WITH GFR - Lipid panel  4. Chronic pain syndrome PDMP database checked and no concerns - refill on meds sent today - traMADol (ULTRAM) 50 MG tablet; Take 1 tablet (50 mg total) by mouth every 8 (eight) hours as needed for severe pain (chronic back pain).  Dispense: 270 tablet; Refill: 0  5. Fibromyalgia Fairly well controlled with cymbalta, gabapentin, voltaren gel, tramadol, baclofen  6. Need for influenza vaccination - Flu Vaccine QUAD 6+ mos PF IM (Fluarix Quad PF)  7. Need for pneumococcal vaccine - Pneumococcal polysaccharide vaccine 23-valent greater than or equal to 2yo subcutaneous/IM  8. Encounter for medication monitoring - CBC with Differential/Platelet - COMPLETE METABOLIC PANEL WITH GFR - Lipid panel - Hemoglobin  A1c   Return in about 3 months (around 07/21/2020) for Routine follow-up pain mangement.   Delsa Grana, PA-C 04/20/20 2:34 PM

## 2020-04-21 ENCOUNTER — Encounter: Payer: Self-pay | Admitting: Family Medicine

## 2020-04-21 LAB — CBC WITH DIFFERENTIAL/PLATELET
Absolute Monocytes: 1008 cells/uL — ABNORMAL HIGH (ref 200–950)
Basophils Absolute: 60 cells/uL (ref 0–200)
Basophils Relative: 0.5 %
Eosinophils Absolute: 168 cells/uL (ref 15–500)
Eosinophils Relative: 1.4 %
HCT: 42.9 % (ref 35.0–45.0)
Hemoglobin: 14.6 g/dL (ref 11.7–15.5)
Lymphs Abs: 3180 cells/uL (ref 850–3900)
MCH: 30.9 pg (ref 27.0–33.0)
MCHC: 34 g/dL (ref 32.0–36.0)
MCV: 90.7 fL (ref 80.0–100.0)
MPV: 11.9 fL (ref 7.5–12.5)
Monocytes Relative: 8.4 %
Neutro Abs: 7584 cells/uL (ref 1500–7800)
Neutrophils Relative %: 63.2 %
Platelets: 286 10*3/uL (ref 140–400)
RBC: 4.73 10*6/uL (ref 3.80–5.10)
RDW: 12.2 % (ref 11.0–15.0)
Total Lymphocyte: 26.5 %
WBC: 12 10*3/uL — ABNORMAL HIGH (ref 3.8–10.8)

## 2020-04-21 LAB — LIPID PANEL
Cholesterol: 129 mg/dL (ref <200)
HDL: 42 mg/dL — ABNORMAL LOW (ref >=50)
LDL Cholesterol (Calc): 64 mg/dL (calc)
Non-HDL Cholesterol (Calc): 87 mg/dL (calc) (ref <130)
Total CHOL/HDL Ratio: 3.1 (calc) (ref <5.0)
Triglycerides: 157 mg/dL — ABNORMAL HIGH (ref <150)

## 2020-04-21 LAB — COMPLETE METABOLIC PANEL WITH GFR
AG Ratio: 1.5 (calc) (ref 1.0–2.5)
ALT: 24 U/L (ref 6–29)
AST: 21 U/L (ref 10–35)
Albumin: 4.1 g/dL (ref 3.6–5.1)
Alkaline phosphatase (APISO): 79 U/L (ref 37–153)
BUN: 15 mg/dL (ref 7–25)
CO2: 23 mmol/L (ref 20–32)
Calcium: 9.5 mg/dL (ref 8.6–10.4)
Chloride: 107 mmol/L (ref 98–110)
Creat: 0.62 mg/dL (ref 0.50–1.05)
GFR, Est African American: 118 mL/min/{1.73_m2} (ref >=60)
GFR, Est Non African American: 102 mL/min/{1.73_m2} (ref >=60)
Globulin: 2.7 g/dL (calc) (ref 1.9–3.7)
Glucose, Bld: 178 mg/dL — ABNORMAL HIGH (ref 65–99)
Potassium: 4.2 mmol/L (ref 3.5–5.3)
Sodium: 139 mmol/L (ref 135–146)
Total Bilirubin: 0.2 mg/dL (ref 0.2–1.2)
Total Protein: 6.8 g/dL (ref 6.1–8.1)

## 2020-04-21 LAB — HEMOGLOBIN A1C
Hgb A1c MFr Bld: 6.9 % of total Hgb — ABNORMAL HIGH (ref <5.7)
Mean Plasma Glucose: 151 (calc)
eAG (mmol/L): 8.4 (calc)

## 2020-05-01 ENCOUNTER — Other Ambulatory Visit: Payer: Self-pay | Admitting: Family Medicine

## 2020-05-10 ENCOUNTER — Other Ambulatory Visit: Payer: Self-pay | Admitting: Family Medicine

## 2020-05-10 DIAGNOSIS — J449 Chronic obstructive pulmonary disease, unspecified: Secondary | ICD-10-CM

## 2020-05-10 DIAGNOSIS — J45909 Unspecified asthma, uncomplicated: Secondary | ICD-10-CM

## 2020-05-17 ENCOUNTER — Other Ambulatory Visit: Payer: Self-pay | Admitting: Family Medicine

## 2020-05-17 DIAGNOSIS — I10 Essential (primary) hypertension: Secondary | ICD-10-CM

## 2020-05-17 DIAGNOSIS — M797 Fibromyalgia: Secondary | ICD-10-CM

## 2020-05-17 DIAGNOSIS — E119 Type 2 diabetes mellitus without complications: Secondary | ICD-10-CM

## 2020-05-17 DIAGNOSIS — M419 Scoliosis, unspecified: Secondary | ICD-10-CM

## 2020-05-25 ENCOUNTER — Other Ambulatory Visit: Payer: Self-pay | Admitting: Family Medicine

## 2020-05-25 DIAGNOSIS — E119 Type 2 diabetes mellitus without complications: Secondary | ICD-10-CM

## 2020-06-04 ENCOUNTER — Other Ambulatory Visit: Payer: Self-pay

## 2020-06-04 MED ORDER — ACCU-CHEK GUIDE W/DEVICE KIT: 1.0000 | PACK | Freq: Every day | 0 refills | Status: AC

## 2020-06-10 ENCOUNTER — Other Ambulatory Visit: Payer: Self-pay | Admitting: Family Medicine

## 2020-06-10 DIAGNOSIS — J329 Chronic sinusitis, unspecified: Secondary | ICD-10-CM

## 2020-06-10 DIAGNOSIS — J441 Chronic obstructive pulmonary disease with (acute) exacerbation: Secondary | ICD-10-CM

## 2020-07-03 ENCOUNTER — Other Ambulatory Visit: Payer: Self-pay | Admitting: Family Medicine

## 2020-07-07 ENCOUNTER — Encounter: Payer: Self-pay | Admitting: Family Medicine

## 2020-07-14 ENCOUNTER — Other Ambulatory Visit: Payer: Self-pay | Admitting: Family Medicine

## 2020-07-14 DIAGNOSIS — G894 Chronic pain syndrome: Secondary | ICD-10-CM

## 2020-07-15 ENCOUNTER — Encounter: Payer: Self-pay | Admitting: Family Medicine

## 2020-07-16 ENCOUNTER — Encounter: Payer: Self-pay | Admitting: Family Medicine

## 2020-07-16 ENCOUNTER — Telehealth: Payer: Self-pay | Admitting: Family Medicine

## 2020-07-16 DIAGNOSIS — G894 Chronic pain syndrome: Secondary | ICD-10-CM

## 2020-07-16 NOTE — Telephone Encounter (Signed)
Patient is calling regarding concerns of her traMADol (ULTRAM) 50 MG tablet [[id number]] . Patient is out of medication. Patient used the last pill yesterday. Patient states that she told would need an appt to receive more medication. Patient states that she does not miss her regular appts and she is due for her medication. And wants an updated status.  Patient has an appt 07/21/20. Please advise Preferred Pharmacy- [facility] [phone]

## 2020-07-16 NOTE — Telephone Encounter (Signed)
Patient is calling regarding concerns of her traMADol (ULTRAM) 50 MG tablet [[id number]] . Patient is out of medication. Patient used the last pill yesterday. Patient states that she told would need an appt to receive more medication. Patient states that she does not miss her regular appts and she is due for her medication. And wants an updated status.  Patient has an appt 07/21/20. Please advise Preferred Pharmacy- [facility] [place]- [phone]   

## 2020-07-18 ENCOUNTER — Encounter: Payer: Self-pay | Admitting: Family Medicine

## 2020-07-19 ENCOUNTER — Other Ambulatory Visit: Payer: Self-pay | Admitting: Family Medicine

## 2020-07-19 ENCOUNTER — Encounter: Payer: Self-pay | Admitting: Family Medicine

## 2020-07-19 MED ORDER — TRAMADOL HCL 50 MG PO TABS: 50.0000 mg | ORAL_TABLET | Freq: Three times a day (TID) | ORAL | 0 refills | Status: DC | PRN

## 2020-07-21 ENCOUNTER — Encounter: Payer: Self-pay | Admitting: Family Medicine

## 2020-07-21 ENCOUNTER — Other Ambulatory Visit: Payer: Self-pay

## 2020-07-21 ENCOUNTER — Ambulatory Visit (INDEPENDENT_AMBULATORY_CARE_PROVIDER_SITE_OTHER): Payer: Medicare Other | Admitting: Family Medicine

## 2020-07-21 VITALS — BP 130/80 | HR 72 | Temp 98.4°F | Resp 16 | Ht 67.0 in | Wt 216.0 lb

## 2020-07-21 DIAGNOSIS — D72829 Elevated white blood cell count, unspecified: Secondary | ICD-10-CM

## 2020-07-21 DIAGNOSIS — I10 Essential (primary) hypertension: Secondary | ICD-10-CM | POA: Diagnosis not present

## 2020-07-21 DIAGNOSIS — K219 Gastro-esophageal reflux disease without esophagitis: Secondary | ICD-10-CM

## 2020-07-21 DIAGNOSIS — Z5181 Encounter for therapeutic drug level monitoring: Secondary | ICD-10-CM

## 2020-07-21 DIAGNOSIS — M797 Fibromyalgia: Secondary | ICD-10-CM

## 2020-07-21 DIAGNOSIS — R5383 Other fatigue: Secondary | ICD-10-CM

## 2020-07-21 DIAGNOSIS — F419 Anxiety disorder, unspecified: Secondary | ICD-10-CM

## 2020-07-21 DIAGNOSIS — G894 Chronic pain syndrome: Secondary | ICD-10-CM

## 2020-07-21 DIAGNOSIS — E785 Hyperlipidemia, unspecified: Secondary | ICD-10-CM

## 2020-07-21 DIAGNOSIS — E119 Type 2 diabetes mellitus without complications: Secondary | ICD-10-CM

## 2020-07-21 DIAGNOSIS — F439 Reaction to severe stress, unspecified: Secondary | ICD-10-CM

## 2020-07-21 MED ORDER — ROSUVASTATIN CALCIUM 10 MG PO TABS: 10.0000 mg | ORAL_TABLET | Freq: Every day | ORAL | 3 refills | Status: DC

## 2020-07-21 MED ORDER — LISINOPRIL 20 MG PO TABS: 20.0000 mg | ORAL_TABLET | Freq: Every day | ORAL | 3 refills | Status: DC

## 2020-07-21 MED ORDER — METFORMIN HCL 500 MG PO TABS: 500.0000 mg | ORAL_TABLET | Freq: Two times a day (BID) | ORAL | 3 refills | Status: DC
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
## 2020-07-21 NOTE — Patient Instructions (Signed)
traMADol (ULTRAM) 50 MG tablet 90 tablet 0 07/19/2020    Sig - Route: Take 1 tablet (50 mg total) by mouth every 8 (eight) hours as needed for severe pain (chronic back pain). - Oral   Sent to pharmacy as: traMADol (ULTRAM) 50 MG tablet   Notes to Pharmacy: For chronic pain syndrome   E-Prescribing Status: Receipt confirmed by pharmacy (07/19/2020 5:06 PM EST)

## 2020-07-22 LAB — CBC WITH DIFFERENTIAL/PLATELET
Absolute Monocytes: 775 cells/uL (ref 200–950)
Basophils Absolute: 57 cells/uL (ref 0–200)
Basophils Relative: 0.5 %
Eosinophils Absolute: 148 cells/uL (ref 15–500)
Eosinophils Relative: 1.3 %
HCT: 41.3 % (ref 35.0–45.0)
Hemoglobin: 13.9 g/dL (ref 11.7–15.5)
Lymphs Abs: 2588 cells/uL (ref 850–3900)
MCH: 30.3 pg (ref 27.0–33.0)
MCHC: 33.7 g/dL (ref 32.0–36.0)
MCV: 90.2 fL (ref 80.0–100.0)
MPV: 12.5 fL (ref 7.5–12.5)
Monocytes Relative: 6.8 %
Neutro Abs: 7832 cells/uL — ABNORMAL HIGH (ref 1500–7800)
Neutrophils Relative %: 68.7 %
Platelets: 235 10*3/uL (ref 140–400)
RBC: 4.58 10*6/uL (ref 3.80–5.10)
RDW: 12.4 % (ref 11.0–15.0)
Total Lymphocyte: 22.7 %
WBC: 11.4 10*3/uL — ABNORMAL HIGH (ref 3.8–10.8)

## 2020-07-22 LAB — COMPLETE METABOLIC PANEL WITH GFR
AG Ratio: 1.8 (calc) (ref 1.0–2.5)
ALT: 64 U/L — ABNORMAL HIGH (ref 6–29)
AST: 39 U/L — ABNORMAL HIGH (ref 10–35)
Albumin: 4.2 g/dL (ref 3.6–5.1)
Alkaline phosphatase (APISO): 78 U/L (ref 37–153)
BUN: 15 mg/dL (ref 7–25)
CO2: 23 mmol/L (ref 20–32)
Calcium: 9.5 mg/dL (ref 8.6–10.4)
Chloride: 106 mmol/L (ref 98–110)
Creat: 0.58 mg/dL (ref 0.50–1.05)
GFR, Est African American: 120 mL/min/{1.73_m2} (ref >=60)
GFR, Est Non African American: 104 mL/min/{1.73_m2} (ref >=60)
Globulin: 2.4 g/dL (calc) (ref 1.9–3.7)
Glucose, Bld: 164 mg/dL — ABNORMAL HIGH (ref 65–99)
Potassium: 4.6 mmol/L (ref 3.5–5.3)
Sodium: 140 mmol/L (ref 135–146)
Total Bilirubin: 0.3 mg/dL (ref 0.2–1.2)
Total Protein: 6.6 g/dL (ref 6.1–8.1)

## 2020-07-22 LAB — HEMOGLOBIN A1C
Hgb A1c MFr Bld: 7.4 % of total Hgb — ABNORMAL HIGH (ref <5.7)
Mean Plasma Glucose: 166 (calc)
eAG (mmol/L): 9.2 (calc)

## 2020-07-22 LAB — SPECIMEN COMPROMISED

## 2020-08-17 ENCOUNTER — Other Ambulatory Visit: Payer: Self-pay | Admitting: Family Medicine

## 2020-08-17 DIAGNOSIS — G894 Chronic pain syndrome: Secondary | ICD-10-CM

## 2020-08-20 ENCOUNTER — Other Ambulatory Visit: Payer: Self-pay | Admitting: Family Medicine

## 2020-08-20 DIAGNOSIS — K219 Gastro-esophageal reflux disease without esophagitis: Secondary | ICD-10-CM

## 2020-08-20 DIAGNOSIS — J45909 Unspecified asthma, uncomplicated: Secondary | ICD-10-CM

## 2020-08-20 DIAGNOSIS — J449 Chronic obstructive pulmonary disease, unspecified: Secondary | ICD-10-CM

## 2020-09-11 ENCOUNTER — Other Ambulatory Visit: Payer: Self-pay | Admitting: Family Medicine

## 2020-09-18 ENCOUNTER — Other Ambulatory Visit: Payer: Self-pay | Admitting: Family Medicine

## 2020-09-18 DIAGNOSIS — G894 Chronic pain syndrome: Secondary | ICD-10-CM

## 2020-09-18 DIAGNOSIS — M419 Scoliosis, unspecified: Secondary | ICD-10-CM

## 2020-09-18 DIAGNOSIS — M797 Fibromyalgia: Secondary | ICD-10-CM

## 2020-09-21 ENCOUNTER — Encounter: Payer: Self-pay | Admitting: Family Medicine

## 2020-09-23 ENCOUNTER — Encounter: Payer: Self-pay | Admitting: Family Medicine

## 2020-09-27 ENCOUNTER — Telehealth: Payer: Self-pay | Admitting: Family Medicine

## 2020-09-27 NOTE — Telephone Encounter (Signed)
Copied from CRM [id number]. Topic: Medicare AWV >> Sep 27, 2020 11:15 AM Claudette Laws R wrote: Reason for CRM:   No answer unable to leave a message for patient to notify AWVS scheduled Sep 28, 2020 will be completed by phone instead of in the office

## 2020-09-28 ENCOUNTER — Ambulatory Visit: Payer: 59

## 2020-10-02 ENCOUNTER — Other Ambulatory Visit: Payer: Self-pay | Admitting: Family Medicine

## 2020-10-02 DIAGNOSIS — J449 Chronic obstructive pulmonary disease, unspecified: Secondary | ICD-10-CM

## 2020-10-02 DIAGNOSIS — J45909 Unspecified asthma, uncomplicated: Secondary | ICD-10-CM

## 2020-10-11 ENCOUNTER — Other Ambulatory Visit: Payer: Self-pay

## 2020-10-11 MED ORDER — OMEGA-3-ACID ETHYL ESTERS 1 G PO CAPS: 1.0000 g | ORAL_CAPSULE | Freq: Two times a day (BID) | ORAL | 3 refills | Status: DC

## 2020-10-21 ENCOUNTER — Encounter: Payer: Self-pay | Admitting: Family Medicine

## 2020-10-21 ENCOUNTER — Ambulatory Visit (INDEPENDENT_AMBULATORY_CARE_PROVIDER_SITE_OTHER): Payer: 59 | Admitting: Family Medicine

## 2020-10-21 ENCOUNTER — Other Ambulatory Visit: Payer: Self-pay

## 2020-10-21 VITALS — BP 124/78 | HR 89 | Temp 98.5°F | Resp 16 | Ht 67.0 in | Wt 211.9 lb

## 2020-10-21 DIAGNOSIS — F419 Anxiety disorder, unspecified: Secondary | ICD-10-CM

## 2020-10-21 DIAGNOSIS — Z1159 Encounter for screening for other viral diseases: Secondary | ICD-10-CM

## 2020-10-21 DIAGNOSIS — E785 Hyperlipidemia, unspecified: Secondary | ICD-10-CM

## 2020-10-21 DIAGNOSIS — I10 Essential (primary) hypertension: Secondary | ICD-10-CM | POA: Diagnosis not present

## 2020-10-21 DIAGNOSIS — E119 Type 2 diabetes mellitus without complications: Secondary | ICD-10-CM | POA: Diagnosis not present

## 2020-10-21 DIAGNOSIS — Z5181 Encounter for therapeutic drug level monitoring: Secondary | ICD-10-CM | POA: Diagnosis not present

## 2020-10-21 DIAGNOSIS — G894 Chronic pain syndrome: Secondary | ICD-10-CM

## 2020-10-21 DIAGNOSIS — Z114 Encounter for screening for human immunodeficiency virus [HIV]: Secondary | ICD-10-CM

## 2020-10-21 DIAGNOSIS — K219 Gastro-esophageal reflux disease without esophagitis: Secondary | ICD-10-CM

## 2020-10-21 DIAGNOSIS — Z1231 Encounter for screening mammogram for malignant neoplasm of breast: Secondary | ICD-10-CM

## 2020-10-21 DIAGNOSIS — Z1211 Encounter for screening for malignant neoplasm of colon: Secondary | ICD-10-CM

## 2020-10-21 MED ORDER — TRAMADOL HCL 50 MG PO TABS: 50.0000 mg | ORAL_TABLET | Freq: Three times a day (TID) | ORAL | 0 refills | Status: DC | PRN

## 2020-10-21 MED ORDER — TRAMADOL HCL 50 MG PO TABS: 50.0000 mg | ORAL_TABLET | Freq: Three times a day (TID) | ORAL | 0 refills | Status: AC | PRN

## 2020-10-21 MED ORDER — BUSPIRONE HCL 15 MG PO TABS: 7.5000 mg | ORAL_TABLET | Freq: Three times a day (TID) | ORAL | 1 refills | Status: AC | PRN

## 2020-10-21 MED ORDER — METFORMIN HCL 500 MG PO TABS: 1000.0000 mg | ORAL_TABLET | Freq: Two times a day (BID) | ORAL | 1 refills | Status: DC

## 2020-10-21 MED ORDER — TRAMADOL HCL 50 MG PO TABS: ORAL_TABLET | ORAL | 0 refills | Status: DC

## 2020-10-21 NOTE — Patient Instructions (Signed)
Health Maintenance  Topic Date Due  . Eye exam for diabetics  Never done  . Mammogram  Never done  . COVID-19 Vaccine (3 - Booster for Pfizer series) 06/02/2020  . Colon Cancer Screening  01/14/2021*  .  Hepatitis C: One time screening is recommended by Center for Disease Control  (CDC) for  adults born from [birth year] through 1966.   04/20/2021*  . Hemoglobin A1C  01/18/2021  . Complete foot exam   04/20/2021  . Tetanus Vaccine  12/05/2024  . Flu Shot  Completed  . Pneumococcal vaccine  Completed  . HIV Screening  Completed  . Pap Smear  Discontinued  *Topic was postponed. The date shown is not the original due date.   Diabetes - goal is to have A1C less than 7.0 Lab Results  Component Value Date   HGBA1C 7.4 (H) 07/21/2020   HGBA1C 6.9 (H) 04/20/2020   HGBA1C 7.3 (H) 01/12/2020    Please call and schedule your mammogram  [facility] [address] [place],  [place]  [address] Get Driving Directions Main: [phone]

## 2020-10-21 NOTE — Progress Notes (Signed)
Name: Angela Herman   MRN: [id number]    DOB: [birth year]   Date:10/21/2020       Progress Note  Chief Complaint  Patient presents with  . Follow-up    3 months   . Diabetes  . Hyperlipidemia  . Depression    Subjective:   Angela Herman is a 55 y.o. female, presents to clinic for routine f/up  DM - uncontrolled on metformin 500 mg BID, last A1C elevated On statin and ACEI, due for eye exam Denies: Polyuria, polydipsia, vision changes, neuropathy, hypoglycemia Recent pertinent labs: Lab Results  Component Value Date   HGBA1C 7.4 (H) 07/21/2020   HGBA1C 6.9 (H) 04/20/2020   HGBA1C 7.3 (H) 01/12/2020   Hypertension:  Currently managed on lisinopril 20 mg Pt reports good med compliance and denies any SE.   Blood pressure today is well controlled. BP Readings from Last 3 Encounters:  10/21/20 124/78  07/21/20 130/80  04/20/20 126/84  Pt denies CP, SOB, exertional sx, LE edema, palpitation, Ha's, visual disturbances, lightheadedness, hypotension, syncope.  Hyperlipidemia: Currently treated with crestor 10, pt reports good med compliance Last Lipids: Lab Results  Component Value Date   CHOL 129 04/20/2020   HDL 42 (L) 04/20/2020   LDLCALC 64 04/20/2020   TRIG 157 (H) 04/20/2020   CHOLHDL 3.1 04/20/2020   - Denies: Chest pain, shortness of breath, myalgias, claudication  GERD/Nausea on protonix and is getting zofran refills every couple of months Due for colonoscopy, referred to GI, may be helpful to do GI consult and may need upper endoscopy since uncontrolled   Chronic pain - refill on tramadol due - good compliance  Also managed with braces, cymbalta, baclofen, gabapentin dicofenac gel  Obesity - weight down a little Wt Readings from Last 5 Encounters:  10/21/20 211 lb 14.4 oz (96.1 kg)  07/21/20 216 lb (98 kg)  04/20/20 211 lb 3.2 oz (95.8 kg)  01/15/20 215 lb 4.8 oz (97.7 kg)  01/12/20 212 lb 11.2 oz (96.5 kg)   BMI Readings from Last 5 Encounters:   10/21/20 33.19 kg/m  07/21/20 33.83 kg/m  04/20/20 33.08 kg/m  01/15/20 33.72 kg/m  01/12/20 33.31 kg/m   COPD - pt states breathing is stable   Anxiety - still taking buspar - still a lot going on with kids, family, legal issued, difficulty getting daughter into psychiatry    Current Outpatient Medications:  .  baclofen (LIORESAL) 10 MG tablet, TAKE 1 TABLET BY MOUTH THREE TIMES A DAY, Disp: 270 tablet, Rfl: 3 .  Blood Glucose Monitoring Suppl (ACCU-CHEK GUIDE) w/Device KIT, 1 kit by Does not apply route daily. DX:E11.9,  LON:99 Check BG qd, Disp: 1 kit, Rfl: 0 .  Blood Glucose Monitoring Suppl (ONETOUCH VERIO REFLECT) w/Device KIT, Check blood sugar once daily in the morning prior to breakfast, Disp: 1 kit, Rfl: 0 .  busPIRone (BUSPAR) 15 MG tablet, TAKE 1 TABLET (15 MG TOTAL) BY MOUTH 3 (THREE) TIMES DAILY., Disp: 270 tablet, Rfl: 1 .  diclofenac Sodium (VOLTAREN) 1 % GEL, APPLY 4 G TOPICALLY 2 (TWO) TIMES DAILY., Disp: 100 g, Rfl: 11 .  DULoxetine (CYMBALTA) 20 MG capsule, Take 2 capsules (40 mg total) by mouth daily., Disp: 180 capsule, Rfl: 3 .  Fluticasone-Salmeterol (ADVAIR) 250-50 MCG/DOSE AEPB, Inhale 1 puff into the lungs 2 (two) times daily., Disp: 60 each, Rfl: 11 .  gabapentin (NEURONTIN) 300 MG capsule, TAKE 1 CAPSULE BY MOUTH EVERY MORNING,1 CAPSULE AT NOON AND 3 CAPSULES AT BEDTIME, Disp:  450 capsule, Rfl: 3 .  ipratropium-albuterol (DUONEB) 0.5-2.5 (3) MG/3ML SOLN, Take 3 mLs by nebulization every 6 (six) hours as needed (for worse cough, SOB and wheeze for COPD and asthma exacerbation)., Disp: 180 mL, Rfl: 1 .  lisinopril (ZESTRIL) 20 MG tablet, Take 1 tablet (20 mg total) by mouth daily., Disp: 90 tablet, Rfl: 3 .  metFORMIN (GLUCOPHAGE) 500 MG tablet, Take 1 tablet (500 mg total) by mouth 2 (two) times daily with a meal., Disp: 180 tablet, Rfl: 3 .  ondansetron (ZOFRAN) 8 MG tablet, TAKE 1 TABLET (8 MG TOTAL) BY MOUTH EVERY 8 (EIGHT) HOURS AS NEEDED FOR NAUSEA OR  VOMITING., Disp: 20 tablet, Rfl: 0 .  ONETOUCH VERIO test strip, 1 each daily., Disp: , Rfl:  .  pantoprazole (PROTONIX) 40 MG tablet, TAKE 1 TABLET BY MOUTH EVERY DAY, Disp: 90 tablet, Rfl: 1 .  rosuvastatin (CRESTOR) 10 MG tablet, Take 1 tablet (10 mg total) by mouth at bedtime., Disp: 90 tablet, Rfl: 3 .  traMADol (ULTRAM) 50 MG tablet, TAKE 1 TABLET BY MOUTH EVERY 8 HOURS AS NEEDED FOR SEVERE PAIN., Disp: 90 tablet, Rfl: 0 .  VENTOLIN HFA 108 (90 Base) MCG/ACT inhaler, INHALE 2 PUFFS INTO THE LUNGS EVERY 4 (FOUR) HOURS AS NEEDED FOR WHEEZING OR SHORTNESS OF BREATH., Disp: 18 each, Rfl: 3 .  omega-3 acid ethyl esters (LOVAZA) 1 g capsule, Take 1 capsule (1 g total) by mouth 2 (two) times daily. (Patient not taking: Reported on 10/21/2020), Disp: 360 capsule, Rfl: 3  Patient Active Problem List   Diagnosis Date Noted  . New onset type 2 diabetes mellitus ([place]) 01/27/2020  . Chronic pain syndrome 01/15/2020  . Dyslipidemia 06/13/2019  . Varicose veins of leg with pain, right 06/13/2019  . Bilateral lower extremity edema 06/13/2019  . Scoliosis of thoracic spine 06/13/2019  . Fibromyalgia 06/13/2019  . Asthma 06/13/2019  . Chronic obstructive pulmonary disease ([place]) 06/13/2019  . Prediabetes 06/13/2019  . Essential hypertension 06/13/2019  . Gastroesophageal reflux disease 06/13/2019  . Anxiety disorder 06/13/2019    Past Surgical History:  Procedure Laterality Date  . ABDOMINAL HYSTERECTOMY  2009  . CATARACT EXTRACTION    . CHOLECYSTECTOMY  2012  . CORNEA LACERATION REPAIR    . right retinal detachment Right   . TUBAL LIGATION  1988    Family History  Adopted: Yes  Problem Relation Age of Onset  . Bipolar disorder Daughter   . Drug abuse Daughter   . Hypertension Son     Social History   Tobacco Use  . Smoking status: Former Smoker    Packs/day: 0.50    Years: 36.00    Pack years: 18.00    Types: Cigarettes    Quit date: 04/16/2019    Years since quitting: 1.5  .  Smokeless tobacco: Never Used  Vaping Use  . Vaping Use: Former  . Substances: Nicotine  Substance Use Topics  . Alcohol use: Never  . Drug use: Not Currently    Comment: marijuana in the past     Allergies  Allergen Reactions  . Aspirin   . [place]   . Codeine     Health Maintenance  Topic Date Due  . OPHTHALMOLOGY EXAM  Never done  . MAMMOGRAM  Never done  . COVID-19 Vaccine (3 - Booster for Pfizer series) 06/02/2020  . COLONOSCOPY (Pts 45-12yr Insurance coverage will need to be confirmed)  01/14/2021 (Originally 05/25/2019)  . Hepatitis C Screening  04/20/2021 (Originally 41966-08-04  .  HEMOGLOBIN A1C  01/18/2021  . FOOT EXAM  04/20/2021  . TETANUS/TDAP  12/05/2024  . INFLUENZA VACCINE  Completed  . PNEUMOCOCCAL POLYSACCHARIDE VACCINE AGE 2-64 HIGH RISK  Completed  . HIV Screening  Completed  . PAP SMEAR-Modifier  Discontinued    Chart Review Today: I personally reviewed active problem list, medication list, allergies, family history, social history, health maintenance, notes from last encounter, lab results, imaging with the patient/caregiver today.  Review of Systems  Constitutional: Negative.   HENT: Negative.   Eyes: Negative.   Respiratory: Negative.   Cardiovascular: Negative.   Gastrointestinal: Negative.   Endocrine: Negative.   Genitourinary: Negative.   Musculoskeletal: Negative.   Skin: Negative.   Allergic/Immunologic: Negative.   Neurological: Negative.   Hematological: Negative.   Psychiatric/Behavioral: Negative.   All other systems reviewed and are negative.    Objective:   Vitals:   10/21/20 0915  BP: 124/78  Pulse: 89  Resp: 16  Temp: 98.5 F (36.9 C)  SpO2: 96%  Weight: 211 lb 14.4 oz (96.1 kg)  Height: _0  (1.702 m)    Body mass index is 33.19 kg/m.  Physical Exam Vitals and nursing note reviewed.  Constitutional:      General: She is not in acute distress.    Appearance: Normal appearance. She is well-developed. She is  obese. She is not ill-appearing, toxic-appearing or diaphoretic.     Interventions: Face mask in place.  HENT:     Head: Normocephalic and atraumatic.     Right Ear: External ear normal.     Left Ear: External ear normal.  Eyes:     General: Lids are normal. No scleral icterus.       Right eye: No discharge.        Left eye: No discharge.     Conjunctiva/sclera: Conjunctivae normal.  Neck:     Trachea: Phonation normal. No tracheal deviation.  Cardiovascular:     Rate and Rhythm: Normal rate and regular rhythm.     Pulses: Normal pulses.          Radial pulses are 2+ on the right side and 2+ on the left side.       Posterior tibial pulses are 2+ on the right side and 2+ on the left side.     Heart sounds: Normal heart sounds. No murmur heard. No friction rub. No gallop.   Pulmonary:     Effort: Pulmonary effort is normal. No respiratory distress.     Breath sounds: Normal breath sounds. No stridor. No wheezing, rhonchi or rales.  Chest:     Chest wall: No tenderness.  Abdominal:     General: Bowel sounds are normal. There is no distension.     Palpations: Abdomen is soft.     Tenderness: There is no abdominal tenderness. There is no guarding.  Musculoskeletal:     Right lower leg: No edema.     Left lower leg: No edema.  Skin:    General: Skin is warm and dry.     Coloration: Skin is not jaundiced or pale.     Findings: No rash.  Neurological:     Mental Status: She is alert.     Motor: No abnormal muscle tone.     Gait: Gait normal.  Psychiatric:        Mood and Affect: Mood normal.        Speech: Speech normal.        Behavior: Behavior normal.  Assessment & Plan:   1. Type 2 diabetes mellitus without complication, without long-term current use of insulin (HCC) Uncontrolled, discussed increasing Metformin dose as tolerated and working on diet efforts, if still elevated may need to add additional medications - Hemoglobin A1C - COMPLETE METABOLIC PANEL  WITH GFR - Lipid panel - Ambulatory referral to Ophthalmology - metFORMIN (GLUCOPHAGE) 500 MG tablet; Take 2 tablets (1,000 mg total) by mouth 2 (two) times daily with a meal.  Dispense: 360 tablet; Refill: 1  2. Dyslipidemia Good compliance with statin but she is not taking Lovaza, is due for labs Currently no myalgias side effects or concerns - COMPLETE METABOLIC PANEL WITH GFR - Lipid panel  3. Essential hypertension Stable, well-controlled, blood pressure at goal today - COMPLETE METABOLIC PANEL WITH GFR  4. Encounter for medication monitoring - Hemoglobin A1C - COMPLETE METABOLIC PANEL WITH GFR - Lipid panel - CBC with Differential/Platelet  5. Gastroesophageal reflux disease, unspecified whether esophagitis present Uncontrolled and not improving with PPI and still requiring as needed nausea medications encourage the patient to consult with gastroenterology for further assessment and encouraged her to do this since she is also due for colonoscopy - Ambulatory referral to Gastroenterology  6. Chronic pain syndrome Reviewed the controlled substance database and reviewed her prescriptions she is using multiple other medications and other conservative management and commendation with tramadol - traMADol (ULTRAM) 50 MG tablet; TAKE 1 TABLET BY MOUTH EVERY 8 HOURS AS NEEDED FOR SEVERE PAIN.  Dispense: 90 tablet; Refill: 0 - traMADol (ULTRAM) 50 MG tablet; Take 1 tablet (50 mg total) by mouth every 8 (eight) hours as needed for severe pain.  Dispense: 90 tablet; Refill: 0 - traMADol (ULTRAM) 50 MG tablet; Take 1 tablet (50 mg total) by mouth every 8 (eight) hours as needed for severe pain.  Dispense: 90 tablet; Refill: 0  7. Anxiety disorder, unspecified type GAD 7 : Generalized Anxiety Score 10/21/2020 07/21/2020 06/13/2019  Nervous, Anxious, on Edge _0 Control/stop worrying _1 Worry too much - different things _2 Trouble relaxing _3 Restless 0 2 2  Easily annoyed  or irritable _4 Afraid - awful might happen 0 2 0  Total GAD 7 Score _5 Anxiety Difficulty Somewhat difficult Somewhat difficult Somewhat difficult   Depression screen [facility] 2/9 10/21/2020 07/21/2020 04/20/2020  Decreased Interest 1 1 0  Down, Depressed, Hopeless 1 2 0  PHQ - 2 Score 2 3 0  Altered sleeping 2 2 0  Tired, decreased energy 2 2 0  Change in appetite 1 2 0  Feeling bad or failure about yourself  1 2 0  Trouble concentrating 1 2 0  Moving slowly or fidgety/restless 0 0 0  Suicidal thoughts 0 0 0  PHQ-9 Score 9 13 0  Difficult doing work/chores Somewhat difficult Somewhat difficult Not difficult at all   GAD-7 and PHQ-9 reviewed and both still positive she continues to use BuSpar continues to have significant stressors in her family and with her husband's health and with her children - busPIRone (BUSPAR) 15 MG tablet; Take 0.5-1 tablets (7.5-15 mg total) by mouth 3 (three) times daily as needed.  Dispense: 270 tablet; Refill: 1  8. Encounter for hepatitis C screening test for low risk patient - Hepatitis C Antibody  9. Screening for HIV without presence of risk factors - HIV antibody (with reflex)  10. Screening for colon cancer - Ambulatory referral  to Gastroenterology  11. Encounter for screening mammogram for malignant neoplasm of breast - MM 3D SCREEN BREAST BILATERAL; Future    Return in about 3 months (around 01/18/2021) for Routine follow-up.   Delsa Grana, PA-C 10/21/20 9:38 AM

## 2020-10-24 ENCOUNTER — Other Ambulatory Visit: Payer: Self-pay | Admitting: Family Medicine

## 2020-11-01 ENCOUNTER — Encounter: Payer: Self-pay | Admitting: *Deleted

## 2020-11-19 ENCOUNTER — Encounter: Payer: Self-pay | Admitting: Family Medicine

## 2020-11-19 ENCOUNTER — Other Ambulatory Visit: Payer: Self-pay | Admitting: Family Medicine

## 2020-11-19 DIAGNOSIS — G894 Chronic pain syndrome: Secondary | ICD-10-CM

## 2020-11-30 ENCOUNTER — Telehealth: Payer: Self-pay | Admitting: Family Medicine

## 2020-11-30 NOTE — Telephone Encounter (Signed)
Copied from CRM [id number]. Topic: Medicare AWV >> Nov 30, 2020  2:27 PM Claudette Laws R wrote: Reason for CRM: Left message for patient to call back and schedule Medicare Annual Wellness Visit (AWV) in office.   If unable to come into the office for AWV,  please offer to do virtually or by telephone.  Last AWV: 09/25/2019  Please schedule at anytime with [facility] Health Advisor.  40 minute appointment  Any questions, please contact me at [phone]

## 2020-12-08 ENCOUNTER — Other Ambulatory Visit: Payer: Self-pay

## 2020-12-08 DIAGNOSIS — M419 Scoliosis, unspecified: Secondary | ICD-10-CM

## 2020-12-08 DIAGNOSIS — M797 Fibromyalgia: Secondary | ICD-10-CM

## 2020-12-09 ENCOUNTER — Ambulatory Visit: Payer: 59

## 2020-12-19 ENCOUNTER — Emergency Department (HOSPITAL_COMMUNITY): Payer: 59

## 2020-12-19 ENCOUNTER — Emergency Department (HOSPITAL_COMMUNITY)
Admission: EM | Admit: 2020-12-19 | Discharge: 2020-12-19 | Disposition: A | Discharge status: Home / Self Care | Payer: 59 | Attending: Emergency Medicine | Admitting: Emergency Medicine

## 2020-12-19 DIAGNOSIS — M25512 Pain in left shoulder: Secondary | ICD-10-CM | POA: Insufficient documentation

## 2020-12-19 DIAGNOSIS — M25572 Pain in left ankle and joints of left foot: Secondary | ICD-10-CM | POA: Insufficient documentation

## 2020-12-19 DIAGNOSIS — M25531 Pain in right wrist: Secondary | ICD-10-CM | POA: Diagnosis not present

## 2020-12-19 DIAGNOSIS — Z79899 Other long term (current) drug therapy: Secondary | ICD-10-CM | POA: Diagnosis not present

## 2020-12-19 DIAGNOSIS — Z7951 Long term (current) use of inhaled steroids: Secondary | ICD-10-CM | POA: Insufficient documentation

## 2020-12-19 DIAGNOSIS — M25511 Pain in right shoulder: Secondary | ICD-10-CM | POA: Diagnosis not present

## 2020-12-19 DIAGNOSIS — J45909 Unspecified asthma, uncomplicated: Secondary | ICD-10-CM | POA: Insufficient documentation

## 2020-12-19 DIAGNOSIS — M2550 Pain in unspecified joint: Secondary | ICD-10-CM

## 2020-12-19 DIAGNOSIS — Z87891 Personal history of nicotine dependence: Secondary | ICD-10-CM | POA: Insufficient documentation

## 2020-12-19 DIAGNOSIS — J449 Chronic obstructive pulmonary disease, unspecified: Secondary | ICD-10-CM | POA: Diagnosis not present

## 2020-12-19 DIAGNOSIS — E119 Type 2 diabetes mellitus without complications: Secondary | ICD-10-CM | POA: Diagnosis not present

## 2020-12-19 DIAGNOSIS — Z7984 Long term (current) use of oral hypoglycemic drugs: Secondary | ICD-10-CM | POA: Diagnosis not present

## 2020-12-19 DIAGNOSIS — I10 Essential (primary) hypertension: Secondary | ICD-10-CM | POA: Insufficient documentation

## 2020-12-19 LAB — BASIC METABOLIC PANEL
Anion gap: 11 (ref 5–15)
BUN: 14 mg/dL (ref 6–20)
CO2: 22 mmol/L (ref 22–32)
Calcium: 9.6 mg/dL (ref 8.9–10.3)
Chloride: 101 mmol/L (ref 98–111)
Creatinine, Ser: 0.57 mg/dL (ref 0.44–1.00)
GFR, Estimated: >60 mL/min (ref >60)
Glucose, Bld: 146 mg/dL — ABNORMAL HIGH (ref 70–99)
Potassium: 4.1 mmol/L (ref 3.5–5.1)
Sodium: 134 mmol/L — ABNORMAL LOW (ref 135–145)

## 2020-12-19 LAB — CBC WITH DIFFERENTIAL/PLATELET
Abs Immature Granulocytes: 0.16 10*3/uL — ABNORMAL HIGH (ref 0.00–0.07)
Basophils Absolute: 0 10*3/uL (ref 0.0–0.1)
Basophils Relative: 0 %
Eosinophils Absolute: 0 10*3/uL (ref 0.0–0.5)
Eosinophils Relative: 0 %
HCT: 44.9 % (ref 36.0–46.0)
Hemoglobin: 15.3 g/dL — ABNORMAL HIGH (ref 12.0–15.0)
Immature Granulocytes: 1 %
Lymphocytes Relative: 9 %
Lymphs Abs: 1.8 10*3/uL (ref 0.7–4.0)
MCH: 30.8 pg (ref 26.0–34.0)
MCHC: 34.1 g/dL (ref 30.0–36.0)
MCV: 90.5 fL (ref 80.0–100.0)
Monocytes Absolute: 2.2 10*3/uL — ABNORMAL HIGH (ref 0.1–1.0)
Monocytes Relative: 11 %
Neutro Abs: 15 10*3/uL — ABNORMAL HIGH (ref 1.7–7.7)
Neutrophils Relative %: 79 %
Platelets: 276 10*3/uL (ref 150–400)
RBC: 4.96 MIL/uL (ref 3.87–5.11)
RDW: 12.3 % (ref 11.5–15.5)
WBC: 19.1 10*3/uL — ABNORMAL HIGH (ref 4.0–10.5)
nRBC: 0 % (ref 0.0–0.2)

## 2020-12-19 MED ORDER — MORPHINE SULFATE (PF) 4 MG/ML IV SOLN
4.0000 mg | Freq: Once | INTRAVENOUS | Status: AC
  Administered 2020-12-19: 4 mg via INTRAVENOUS
  Filled 2020-12-19: qty 1

## 2020-12-19 MED ORDER — DOXYCYCLINE HYCLATE 100 MG PO TABS
200.0000 mg | ORAL_TABLET | Freq: Once | ORAL | Status: AC
  Administered 2020-12-19: 200 mg via ORAL
  Filled 2020-12-19: qty 2

## 2020-12-19 MED ORDER — DEXAMETHASONE SODIUM PHOSPHATE 10 MG/ML IJ SOLN
10.0000 mg | Freq: Once | INTRAMUSCULAR | Status: AC
  Administered 2020-12-19: 10 mg via INTRAVENOUS
  Filled 2020-12-19: qty 1

## 2020-12-19 NOTE — ED Notes (Signed)
Patient discharge instructions reviewed with the patient. The patient verbalized understanding of instructions. Patient discharged. 

## 2020-12-19 NOTE — Discharge Instructions (Signed)
-  Your x-rays today did not show any broken bones.  As of right now we do not have a cause for your joint pain.  We have treated you with a steroid and an antibiotic doxycycline.  You will need to follow-up with your primary care doctor soon as possible for further evaluation of this.  I have given you information about Lyme disease and polymyalgia rheumatica.  I have not formally diagnosed you with these however consider these as a possible cause of your symptoms.  Return to the ER if you develop fever or any worsening symptoms.

## 2020-12-19 NOTE — ED Provider Notes (Signed)
[facility] EMERGENCY DEPARTMENT Provider Note   CSN: [id number] Arrival date & time: 12/19/20  1146     History Chief Complaint  Patient presents with  . Pain    Angela Herman is a 55 y.o. female with past medical history significant for anxiety, bipolar 1 disorder, COPD, fibromyalgia on chronic pain medicine tramadol, GERD, hypertension, osteoarthritis, viral hepatitis C.  HPI  To emergency room today via EMS with chief complaint of pain.  Patient states she has had severe pain in her shoulders, right wrist and left ankle for the last 3 days.  Has progressively worsened.  She states she had gout in the summer 2020.  At that time she had pain in her left wrist and elbow.  She states she saw her PCP back when she lived in the [place] and had elevated uric acid levels.  She has had had no problems with gout since then.  Patient states the pain for started this time in her right wrist.  She describes it as a throbbing sensation.  She noticed swelling in her right hand day of symptom onset as well.  The next day she noticed that she had pain in bilateral shoulders.  She was unable to raise her arms or do any of her typical ADLs.  She had to call her daughter for help.  The third day she began to develop pain in her left ankle.  She is also endorsing swelling to the ankle.  She rates all of her pain 10 out of 10 in severity.  EMS gave fentanyl 100 mcg in route.  Patient states that did not help very much.  She denies any fevers.  She denies any recent falls or traumatic injury.  She denies any recent travel or camping.  Denies any tick bite.  Denies history of STDs.  Has not been sexually active in over 1 year.  Past Medical History:  Diagnosis Date  . Anxiety   . Asthma   . Bipolar 1 disorder ([place])   . Chronic anal fissure   . COPD (chronic obstructive pulmonary disease) ([place])   . Fibromyalgia   . GERD (gastroesophageal reflux disease)   . Hyperglycemia   . Hypertension    . Obesity   . Osteoarthritis   . Scoliosis   . Varicose veins of leg with pain, right   . Viral hepatitis C     Patient Active Problem List   Diagnosis Date Noted  . New onset type 2 diabetes mellitus ([place]) 01/27/2020  . Chronic pain syndrome 01/15/2020  . Dyslipidemia 06/13/2019  . Varicose veins of leg with pain, right 06/13/2019  . Bilateral lower extremity edema 06/13/2019  . Scoliosis of thoracic spine 06/13/2019  . Fibromyalgia 06/13/2019  . Asthma 06/13/2019  . Chronic obstructive pulmonary disease ([place]) 06/13/2019  . Prediabetes 06/13/2019  . Essential hypertension 06/13/2019  . Gastroesophageal reflux disease 06/13/2019  . Anxiety disorder 06/13/2019    Past Surgical History:  Procedure Laterality Date  . ABDOMINAL HYSTERECTOMY  2009  . CATARACT EXTRACTION    . CHOLECYSTECTOMY  2012  . CORNEA LACERATION REPAIR    . right retinal detachment Right   . TUBAL LIGATION  1988     OB History   No obstetric history on file.     Family History  Adopted: Yes  Problem Relation Age of Onset  . Bipolar disorder Daughter   . Drug abuse Daughter   . Hypertension Son     Social History  Tobacco Use  . Smoking status: Former Smoker    Packs/day: 0.50    Years: 36.00    Pack years: 18.00    Types: Cigarettes    Quit date: 04/16/2019    Years since quitting: 1.6  . Smokeless tobacco: Never Used  Vaping Use  . Vaping Use: Former  . Substances: Nicotine  Substance Use Topics  . Alcohol use: Never  . Drug use: Not Currently    Comment: marijuana in the past    Home Medications Prior to Admission medications   Medication Sig Start Date End Date Taking? Authorizing Provider  Ascorbic Acid (VITAMIN C PO) Take 1 tablet by mouth daily.   Yes [provider]  Cholecalciferol (D3 PO) Take 1 capsule by mouth daily.   Yes [provider]  Cyanocobalamin (B-12 PO) Take 1 tablet by mouth daily.   Yes [provider]  diclofenac Sodium  (VOLTAREN) 1 % GEL APPLY 4 G TOPICALLY 2 (TWO) TIMES DAILY. Patient taking differently: Apply 4 g topically 4 (four) times daily as needed (pain). 09/21/20  Yes Delsa Grana, PA-C  diphenhydramine-acetaminophen (TYLENOL PM) 25-500 MG TABS tablet Take 2 tablets by mouth at bedtime as needed (headache/pain/sleep).   Yes [provider]  DULoxetine (CYMBALTA) 20 MG capsule Take 2 capsules (40 mg total) by mouth daily. 01/12/20  Yes Delsa Grana, PA-C  Fluticasone-Salmeterol (ADVAIR) 250-50 MCG/DOSE AEPB Inhale 1 puff into the lungs 2 (two) times daily. 01/12/20  Yes Delsa Grana, PA-C  gabapentin (NEURONTIN) 300 MG capsule TAKE 1 CAPSULE BY MOUTH EVERY MORNING,1 CAPSULE AT NOON AND 3 CAPSULES AT BEDTIME Patient taking differently: Take 300-900 mg by mouth See admin instructions. TAKE 378m BY MOUTH EVERY MORNING,3070mAT NOON AND 90065mT BEDTIME 05/19/20  Yes TapDelsa GranaA-C  ipratropium-albuterol (DUONEB) 0.5-2.5 (3) MG/3ML SOLN Take 3 mLs by nebulization every 6 (six) hours as needed (for worse cough, SOB and wheeze for COPD and asthma exacerbation). 06/13/19  Yes TapDelsa GranaA-C  lisinopril (ZESTRIL) 20 MG tablet Take 1 tablet (20 mg total) by mouth daily. 07/21/20  Yes TapDelsa GranaA-C  metFORMIN (GLUCOPHAGE) 500 MG tablet Take 2 tablets (1,000 mg total) by mouth 2 (two) times daily with a meal. 10/21/20  Yes TapDelsa GranaA-C  pantoprazole (PROTONIX) 40 MG tablet TAKE 1 TABLET BY MOUTH EVERY DAY 08/23/20  Yes TapDelsa GranaA-C  rosuvastatin (CRESTOR) 10 MG tablet Take 1 tablet (10 mg total) by mouth at bedtime. 07/21/20  Yes TapDelsa GranaA-C  traMADol (ULTRAM) 50 MG tablet TAKE 1 TABLET BY MOUTH EVERY 8 HOURS AS NEEDED FOR SEVERE PAIN. Patient taking differently: Take 50 mg by mouth every 8 (eight) hours as needed for severe pain. 10/21/20  Yes Tapia, Leisa, PA-C  VENTOLIN HFA 108 (90 Base) MCG/ACT inhaler INHALE 2 PUFFS INTO THE LUNGS EVERY 4 (FOUR) HOURS AS NEEDED FOR WHEEZING OR  SHORTNESS OF BREATH. Patient taking differently: Inhale 2 puffs into the lungs every 4 (four) hours as needed for wheezing or shortness of breath. 08/23/20  Yes TapDelsa GranaA-C  Alcohol Swabs (ALCOHOL PADS) 70 % PADS SMARTSIG:[place]ledget(s) Topical Daily 11/15/20   [provider]  baclofen (LIORESAL) 10 MG tablet TAKE 1 TABLET BY MOUTH THREE TIMES A DAY Patient not taking: Reported on 12/19/2020 07/20/20   TapDelsa GranaA-C  Blood Glucose Calibration (OT ULTRA/FASTTK CNTRL SOLN) SOLN See admin instructions. 11/15/20   [provider]  Blood Glucose Monitoring Suppl (ACCU-CHEK GUIDE) w/Device KIT 1 kit by  Does not apply route daily. DX:E11.9,  LON:99 Check BG qd 06/04/20   Delsa Grana, PA-C  Blood Glucose Monitoring Suppl (ONETOUCH VERIO REFLECT) w/Device KIT Check blood sugar once daily in the morning prior to breakfast 06/01/20   Delsa Grana, PA-C  busPIRone (BUSPAR) 15 MG tablet Take 0.5-1 tablets (7.5-15 mg total) by mouth 3 (three) times daily as needed. Patient not taking: Reported on 12/19/2020 10/21/20   Delsa Grana, PA-C  Easy Comfort Lancets MISC daily. 11/15/20   [provider]  Lancet Devices (EASY MINI EJECT LANCING DEVICE) MISC daily. 11/15/20   [provider]  ondansetron (ZOFRAN) 8 MG tablet TAKE 1 TABLET (8 MG TOTAL) BY MOUTH EVERY 8 (EIGHT) HOURS AS NEEDED FOR NAUSEA OR VOMITING. Patient not taking: Reported on 12/19/2020 09/13/20   Delsa Grana, PA-C  [facility] VERIO test strip 1 each daily. 09/06/20   [provider]  traMADol (ULTRAM) 50 MG tablet Take 1 tablet (50 mg total) by mouth every 8 (eight) hours as needed for severe pain. Patient not taking: Reported on 12/19/2020 12/19/20   Delsa Grana, PA-C  traMADol (ULTRAM) 50 MG tablet Take 1 tablet (50 mg total) by mouth every 8 (eight) hours as needed for severe pain. Patient not taking: Reported on 12/19/2020 11/18/20   Delsa Grana, PA-C    Allergies    Aspirin, Cherry, and  Codeine  Review of Systems   Review of Systems All other systems are reviewed and are negative for acute change except as noted in the HPI.  Physical Exam Updated Vital Signs BP 128/78 (BP Location: Right Arm)   Pulse 76   Temp 98 F (36.7 C) (Oral)   Resp 18   SpO2 96%   Physical Exam Vitals and nursing note reviewed.  Constitutional:      Appearance: She is well-developed. She is not ill-appearing or toxic-appearing.  HENT:     Head: Normocephalic and atraumatic.     Nose: Nose normal.  Eyes:     General: No scleral icterus.       Right eye: No discharge.        Left eye: No discharge.     Conjunctiva/sclera: Conjunctivae normal.  Neck:     Vascular: No JVD.  Cardiovascular:     Rate and Rhythm: Normal rate and regular rhythm.     Pulses: Normal pulses.     Heart sounds: Normal heart sounds.  Pulmonary:     Effort: Pulmonary effort is normal.     Breath sounds: Normal breath sounds.  Abdominal:     General: There is no distension.  Musculoskeletal:     Cervical back: Normal range of motion.     Comments: Tender to palpation of bilateral shoulders.  No obvious deformity.  No swelling noted.  No overlying skin changes.  Right hand without tenderness to palpation.  + swelling. There is no joint effusion noted.  Decreased ROM 2/2 pain. No erythema or warmth overlaying the joint. There is no anatomic snuff box tenderness. Normal sensation and motor function in the median, ulnar, and radial nerve distributions. 2+ radial pulse. Compartments in right upper extremity are soft.   There is swelling and tenderness over the lateral malleolus.No overt deformity. No tenderness over the medial aspect of the ankle. The fifth metatarsal is not tender. The ankle joint is intact without excessive opening on stressing.  Ambulatory with normal gait.   Lymphadenopathy:     Cervical: No cervical adenopathy.  Skin:    General: Skin  is warm and dry.     Capillary Refill: Capillary  refill takes less than 2 seconds.     Findings: No rash.  Neurological:     Mental Status: She is oriented to person, place, and time.     GCS: GCS eye subscore is 4. GCS verbal subscore is 5. GCS motor subscore is 6.     Comments: Fluent speech, no facial droop.  Psychiatric:        Behavior: Behavior normal.     ED Results / Procedures / Treatments   Labs (all labs ordered are listed, but only abnormal results are displayed) Labs Reviewed  BASIC METABOLIC PANEL - Abnormal; Notable for the following components:      Result Value   Sodium 134 (*)    Glucose, Bld 146 (*)    All other components within normal limits  CBC WITH DIFFERENTIAL/PLATELET - Abnormal; Notable for the following components:   WBC 19.1 (*)    Hemoglobin 15.3 (*)    Neutro Abs 15.0 (*)    Monocytes Absolute 2.2 (*)    Abs Immature Granulocytes 0.16 (*)    All other components within normal limits    EKG None  Radiology DG Shoulder Right  Result Date: 12/19/2020 CLINICAL DATA:  Right shoulder pain.  Gout. EXAM: RIGHT SHOULDER - 2+ VIEW COMPARISON:  Chest x-ray 01/15/2020 FINDINGS: There is no evidence of fracture or dislocation. Mild arthropathy of the [facility] joint. No appreciable glenohumeral arthropathy. No erosions. Soft tissues are unremarkable. IMPRESSION: Mild arthropathy of the right AC joint.  No acute findings. Electronically Signed   By: Davina Poke D.O.   On: 12/19/2020 14:57   DG Wrist Complete Right  Result Date: 12/19/2020 CLINICAL DATA:  Right wrist pain and swelling. EXAM: RIGHT WRIST - COMPLETE 3+ VIEW COMPARISON:  None. FINDINGS: The joint spaces are maintained. No degenerative or erosive findings. No chondrocalcinosis. Rounded density near the ulnar styloid could be an old avulsion fracture or unfused secondary ossification center. IMPRESSION: No acute bony findings or significant degenerative changes. Electronically Signed   By: Marijo Sanes M.D.   On: 12/19/2020 13:19   DG Ankle  Complete Right  Result Date: 12/19/2020 CLINICAL DATA:  Right ankle pain. EXAM: RIGHT ANKLE - COMPLETE 3+ VIEW COMPARISON:  None FINDINGS: The ankle mortise is maintained. No ankle fracture, osteochondral lesion or obvious joint effusion. The subtalar and midfoot joint spaces are maintained. No erosive findings. Moderate-sized calcaneal heel spur is noted. IMPRESSION: 1. No acute bony findings or significant degenerative changes. 2. Moderate-sized calcaneal heel spur. Electronically Signed   By: Marijo Sanes M.D.   On: 12/19/2020 13:20   DG Shoulder Left  Result Date: 12/19/2020 CLINICAL DATA:  Left shoulder pain.  Gout. EXAM: LEFT SHOULDER - 2+ VIEW COMPARISON:  Chest x-ray 01/15/2020 FINDINGS: There is no evidence of fracture or dislocation. Mild arthropathy of the left AC joint. Glenohumeral joint appears within normal limits. No erosion is seen. Soft tissues are unremarkable. 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  No acute findings. Electronically Signed   By: Davina Poke D.O.   On: 12/19/2020 14:57   DG Hand Complete Right  Result Date: 12/19/2020 CLINICAL DATA:  Right hand pain and swelling. EXAM: RIGHT HAND - COMPLETE 3+ VIEW COMPARISON:  None. FINDINGS: The joint spaces are maintained. No significant arthropathic findings. No erosions or soft tissue calcifications. No acute fracture. Moderate diffuse soft tissue swelling is noted. No gas is seen in the soft tissues  and no radiopaque foreign bodies are identified. IMPRESSION: 1. No acute bony findings or significant arthropathic changes. 2. Diffuse soft tissue swelling. Electronically Signed   By: Marijo Sanes M.D.   On: 12/19/2020 13:18    Procedures Procedures   Medications Ordered in ED Medications  morphine 4 MG/ML injection 4 mg (4 mg Intravenous Given 12/19/20 1248)  dexamethasone (DECADRON) injection 10 mg (10 mg Intravenous Given 12/19/20 1449)  doxycycline (VIBRA-TABS) tablet 200 mg (200 mg Oral Given 12/19/20  1534)    ED Course  I have reviewed the triage vital signs and the nursing notes.  Pertinent labs & imaging results that were available during my care of the patient were reviewed by me and considered in my medical decision making (see chart for details).    MDM Rules/Calculators/A&P                          History provided by patient with additional history obtained from chart review.    Presenting with polyarthralgia.  Patient is afebrile, hemodynamically stable.  She has swelling of her right hand and left ankle.  Tenderness palpation of bilateral shoulders.  No midline cervical spine tenderness.  No step-off or deformity.  Patient given to medicine by EMS without much improvement therefore dose of morphine given here.  Presentation is not suggestive of gout.  Basic labs checked.  Patient has nonspecific leukocytosis of 19.1.  No anemia, normal platelets.  BMP without significant left light derangement.  Glucose is 146.  Normal kidney function.  Discussed results with patient and she admits to history of leukocytosis without known cause.  She has no fever or other infectious symptoms.  Exam without any joint effusions.  Doubt septic joint.  She denies any recent viral illness.  Has not been sexually active in over 1 year and denies history of STDs.  Lower suspicion for gonorrhea as cause of symptoms either.  Patient given dose of Decadron for inflammation and will cover with doxycycline for prophylaxis of Lyme disease.  She has no rash on exam or report of any recent tick bite so we will hold off on prolonged course of doxycycline treatment. Patient will need to follow-up with PCP for further evaluation of her symptoms.  No indications for hospital admission or further work-up at this time.  She is agreeable with plan of care.  She is requesting a sling for comfort.  Will give patient a sling, discussed importance of continued range of motion to avoid frozen shoulders.  Findings and plan of care  discussed with supervising physician Dr. Regenia Skeeter who agrees with plan of care.    Portions of this note were generated with [employer]. Dictation errors may occur despite best attempts at proofreading.   Final Clinical Impression(s) / ED Diagnoses Final diagnoses:  Polyarthralgia    Rx / DC Orders ED Discharge Orders    None       Lewanda Rife 12/19/20 1553    Sherwood Gambler, MD 12/21/20 [phone]

## 2020-12-19 NOTE — ED Triage Notes (Addendum)
Pt reports that she has gout and reports she is having a lot of gout pain all over that she is unable to control at home.Pt was BIB EMS from home due to pain. Pt was given 100 mcg of Fentanyl

## 2020-12-24 ENCOUNTER — Other Ambulatory Visit: Payer: Self-pay

## 2020-12-24 ENCOUNTER — Telehealth (INDEPENDENT_AMBULATORY_CARE_PROVIDER_SITE_OTHER): Payer: 59 | Admitting: Family Medicine

## 2020-12-24 ENCOUNTER — Encounter: Payer: Self-pay | Admitting: Family Medicine

## 2020-12-24 ENCOUNTER — Other Ambulatory Visit: Payer: Self-pay | Admitting: Family Medicine

## 2020-12-24 DIAGNOSIS — A6920 Lyme disease, unspecified: Secondary | ICD-10-CM | POA: Diagnosis not present

## 2020-12-24 DIAGNOSIS — M25541 Pain in joints of right hand: Secondary | ICD-10-CM | POA: Diagnosis not present

## 2020-12-24 DIAGNOSIS — M25542 Pain in joints of left hand: Secondary | ICD-10-CM

## 2020-12-24 DIAGNOSIS — G894 Chronic pain syndrome: Secondary | ICD-10-CM

## 2020-12-24 MED ORDER — DOXYCYCLINE HYCLATE 100 MG PO CAPS: 100.0000 mg | ORAL_CAPSULE | Freq: Two times a day (BID) | ORAL | 0 refills | Status: AC

## 2020-12-24 MED ORDER — PREDNISONE 20 MG PO TABS: ORAL_TABLET | ORAL | 0 refills | Status: AC

## 2020-12-24 NOTE — Patient Instructions (Signed)
Lyme Disease Lyme disease is an infection that can affect many parts of the body, including the skin, joints, and nervous system. It is a bacterial infection that starts from the bite of an infected tick. Over time, the infection can worsen, and some of the symptoms are similar to the flu. If Lyme disease is not treated, it may cause joint pain, swelling, numbness, problems thinking, fatigue, muscle weakness, and other problems. What are the causes? This condition is caused by bacteria called Borrelia burgdorferi.  You can get Lyme disease by being bitten by an infected tick.  Only black-legged, or Ixodes, ticks that are infected with the bacteria can cause Lyme disease.  The tick must be attached to your skin for a certain period of time to pass along the infection. This is usually 36-48 hours.  Deer often carry infected ticks. What increases the risk? The following factors may make you more likely to develop this condition:  Living in or visiting these areas in the [place] ? [place]. ? The [place]. ? [place].  Spending time in wooded or grassy areas.  Being outdoors with exposed skin.  Camping, gardening, hiking, fishing, hunting, or working outdoors.  Failing to remove a tick from your skin. What are the signs or symptoms? Symptoms of this condition may include:  Chills and fever.  Headache.  Fatigue.  General achiness.  Muscle pain.  Joint pain, often in the knees.  A round, red rash that surrounds the center of the tick bite. The center of the rash may be blood colored or have tiny blisters.  Swollen lymph glands.  Stiff neck.   How is this diagnosed? This condition is diagnosed based on:  Your symptoms and medical history.  A physical exam.  A blood test. How is this treated? The main treatment for this condition is antibiotic medicine, which is usually taken by mouth (orally).  The length of treatment depends on how soon after a  tick bite you begin taking the medicine. In some cases, treatment is necessary for several weeks.  If the infection is severe, antibiotics may need to be given through an IV that is inserted into one of your veins. Follow these instructions at home:  Take over-the-counter and prescription medicines only as told by your health care provider. Finish all antibiotic medicine, even when you start to feel better.  Ask your health care provider about taking a probiotic in between doses of your antibiotic to help avoid an upset stomach or diarrhea.  Check with your health care provider before supplementing your treatment. Many alternative therapies have not been proven and may be harmful to you.  Keep all follow-up visits as told by your health care provider. This is important. How is this prevented? You can become reinfected if you get another tick bite from an infected tick. Take these steps to help prevent an infection:  Cover your skin with light-colored clothing when you are outdoors in the spring and summer months.  Spray clothing and skin with bug spray. The spray should be 20-30% DEET. You can also treat clothing with permethrin, and let it dry before you wear it. Do not apply permethrin directly to your skin. Permethrin can also be used to treat camping gear and boots. Always read and follow the instructions that come with a bug spray or insecticide.  Avoid wooded, grassy, and shaded areas.  Remove yard litter, brush, trash, and plants that attract deer and rodents.  Check yourself for   ticks when you come indoors.  Wash clothing worn each day.  Shower after spending time outdoors.  Check your pets for ticks before they come inside.  If you find a tick attached to your skin: ? Remove it with tweezers. ? Clean your hands and the bite area with rubbing alcohol or soap and water. ? Dispose of the tick by putting it in rubbing alcohol, putting it in a sealed bag or container, or  flushing it down the toilet. ? You may choose to save the tick in a sealed container if you wish for it to be tested at a later time. Pregnant women should take special care to avoid tick bites because it is possible that the infection may be passed along to the fetus.   Contact a health care provider if:  You have symptoms after treatment.  You have removed a tick and want to bring it to your health care provider for testing. Get help right away if:  You have an irregular heartbeat.  You have chest pain.  You have nerve pain.  Your face feels numb.  You develop the following: ? A stiff neck. ? A severe headache. ? Severe nausea and vomiting. ? Sensitivity to light. Summary  Lyme disease is an infection that can affect many parts of the body, including the skin, joints, and nervous system.  This condition is caused by bacteria called Borrelia burgdorferi.  You can get Lyme disease by being bitten by an infected tick.  The main treatment for this condition is antibiotic medicine. This information is not intended to replace advice given to you by your health care provider. Make sure you discuss any questions you have with your health care provider. Document Revised: 12/06/2018 Document Reviewed: 10/31/2018 Elsevier Patient Education  2021 [employer]  

## 2020-12-24 NOTE — Progress Notes (Signed)
Virtual Visit Note  I connected with patient on 12/24/20 at 1027 by telephone due to unable to work Epic video visit and verified that I am speaking with the correct person using two identifiers. Angela Herman is currently located at home and no family members are currently with them during visit. The provider, Laurita Quint Aariona Momon, FNP is located in their office at time of visit.  I discussed the limitations, risks, security and privacy concerns of performing an evaluation and management service by telephone and the availability of in person appointments. I also discussed with the patient that there may be a patient responsible charge related to this service. The patient expressed understanding and agreed to proceed.   I provided 20 minutes of non-face-to-face time during this encounter.  Chief Complaint  Patient presents with  . Follow-up    Seen in ER was told Lyme disease.  Hand swollen, feet swollen, shoulder stiffness, pain    HPI ? 4/24 was seen in the ED and diagnosed with Lyme disease Woke up last Thursday with an inflamed pinky Thought it was gout due to her medical history By Friday morning both hands and right foot were swollen And by Saturday morning it was both hands and feet Was unable to raise shoulders or raise feet Sunday went to the ED  Swelling is now much better and arthralgias is better Some of fingers are still so swollen they will not bend Was given pain medication, dexamethasone and doxy in the ED    Allergies  Allergen Reactions  . Aspirin   . Tenstrike   . Codeine     Prior to Admission medications   Medication Sig Start Date End Date Taking? Authorizing Provider  Ascorbic Acid (VITAMIN C PO) Take 1 tablet by mouth daily.   Yes [provider]  baclofen (LIORESAL) 10 MG tablet TAKE 1 TABLET BY MOUTH THREE TIMES A DAY 07/20/20  Yes Delsa Grana, PA-C  Cholecalciferol (D3 PO) Take 1 capsule by mouth daily.   Yes [provider]   Cyanocobalamin (B-12 PO) Take 1 tablet by mouth daily.   Yes [provider]  diclofenac Sodium (VOLTAREN) 1 % GEL APPLY 4 G TOPICALLY 2 (TWO) TIMES DAILY. Patient taking differently: Apply 4 g topically 4 (four) times daily as needed (pain). 09/21/20  Yes Delsa Grana, PA-C  diphenhydramine-acetaminophen (TYLENOL PM) 25-500 MG TABS tablet Take 2 tablets by mouth at bedtime as needed (headache/pain/sleep).   Yes [provider]  DULoxetine (CYMBALTA) 20 MG capsule Take 2 capsules (40 mg total) by mouth daily. 01/12/20  Yes Delsa Grana, PA-C  Fluticasone-Salmeterol (ADVAIR) 250-50 MCG/DOSE AEPB Inhale 1 puff into the lungs 2 (two) times daily. 01/12/20  Yes Delsa Grana, PA-C  gabapentin (NEURONTIN) 300 MG capsule TAKE 1 CAPSULE BY MOUTH EVERY MORNING,1 CAPSULE AT NOON AND 3 CAPSULES AT BEDTIME Patient taking differently: Take 300-900 mg by mouth See admin instructions. TAKE 385m BY MOUTH EVERY MORNING,3024mAT NOON AND 90024mT BEDTIME 05/19/20  Yes TapDelsa GranaA-C  ipratropium-albuterol (DUONEB) 0.5-2.5 (3) MG/3ML SOLN Take 3 mLs by nebulization every 6 (six) hours as needed (for worse cough, SOB and wheeze for COPD and asthma exacerbation). 06/13/19  Yes TapDelsa GranaA-C  lisinopril (ZESTRIL) 20 MG tablet Take 1 tablet (20 mg total) by mouth daily. 07/21/20  Yes TapDelsa GranaA-C  metFORMIN (GLUCOPHAGE) 500 MG tablet Take 2 tablets (1,000 mg total) by mouth 2 (two) times daily with a meal. 10/21/20  Yes TapDelsa Grana  PA-C  ONETOUCH VERIO test strip 1 each daily. 09/06/20  Yes [provider]  pantoprazole (PROTONIX) 40 MG tablet TAKE 1 TABLET BY MOUTH EVERY DAY 08/23/20  Yes Delsa Grana, PA-C  rosuvastatin (CRESTOR) 10 MG tablet Take 1 tablet (10 mg total) by mouth at bedtime. 07/21/20  Yes Delsa Grana, PA-C  traMADol (ULTRAM) 50 MG tablet TAKE 1 TABLET BY MOUTH EVERY 8 HOURS AS NEEDED FOR SEVERE PAIN. Patient taking differently: Take 50 mg by mouth every 8 (eight)  hours as needed for severe pain. 10/21/20  Yes Tapia, Leisa, PA-C  VENTOLIN HFA 108 (90 Base) MCG/ACT inhaler INHALE 2 PUFFS INTO THE LUNGS EVERY 4 (FOUR) HOURS AS NEEDED FOR WHEEZING OR SHORTNESS OF BREATH. Patient taking differently: Inhale 2 puffs into the lungs every 4 (four) hours as needed for wheezing or shortness of breath. 08/23/20  Yes Delsa Grana, PA-C  Alcohol Swabs (ALCOHOL PADS) 70 % PADS SMARTSIG:[place](s) Topical Daily Patient not taking: Reported on 12/24/2020 11/15/20   [provider]  Blood Glucose Calibration (OT ULTRA/FASTTK CNTRL SOLN) SOLN See admin instructions. Patient not taking: Reported on 12/24/2020 11/15/20   [provider]  Blood Glucose Monitoring Suppl (ACCU-CHEK GUIDE) w/Device KIT 1 kit by Does not apply route daily. DX:E11.9,  LON:99 Check BG qd Patient not taking: Reported on 12/24/2020 06/04/20   Delsa Grana, PA-C  Blood Glucose Monitoring Suppl (ONETOUCH VERIO REFLECT) w/Device KIT Check blood sugar once daily in the morning prior to breakfast Patient not taking: Reported on 12/24/2020 06/01/20   Delsa Grana, PA-C  busPIRone (BUSPAR) 15 MG tablet Take 0.5-1 tablets (7.5-15 mg total) by mouth 3 (three) times daily as needed. Patient not taking: No sig reported 10/21/20   Delsa Grana, PA-C  Easy Comfort Lancets MISC daily. Patient not taking: Reported on 12/24/2020 11/15/20   [provider]  Lancet Devices (EASY MINI EJECT LANCING DEVICE) MISC daily. Patient not taking: Reported on 12/24/2020 11/15/20   [provider]  ondansetron (ZOFRAN) 8 MG tablet TAKE 1 TABLET (8 MG TOTAL) BY MOUTH EVERY 8 (EIGHT) HOURS AS NEEDED FOR NAUSEA OR VOMITING. Patient not taking: No sig reported 09/13/20   Delsa Grana, PA-C  traMADol (ULTRAM) 50 MG tablet Take 1 tablet (50 mg total) by mouth every 8 (eight) hours as needed for severe pain. Patient not taking: No sig reported 12/19/20   Delsa Grana, PA-C  traMADol (ULTRAM) 50 MG tablet Take 1  tablet (50 mg total) by mouth every 8 (eight) hours as needed for severe pain. Patient not taking: No sig reported 11/18/20   Delsa Grana, PA-C    Past Medical History:  Diagnosis Date  . Anxiety   . Asthma   . Bipolar 1 disorder ([place])   . Chronic anal fissure   . COPD (chronic obstructive pulmonary disease) ([place])   . Fibromyalgia   . GERD (gastroesophageal reflux disease)   . Hyperglycemia   . Hypertension   . Obesity   . Osteoarthritis   . Scoliosis   . Varicose veins of leg with pain, right   . Viral hepatitis C     Past Surgical History:  Procedure Laterality Date  . ABDOMINAL HYSTERECTOMY  2009  . CATARACT EXTRACTION    . CHOLECYSTECTOMY  2012  . CORNEA LACERATION REPAIR    . right retinal detachment Right   . TUBAL LIGATION  1988    Social History   Tobacco Use  . Smoking status: Former Smoker    Packs/day: 0.50  Years: 36.00    Pack years: 18.00    Types: Cigarettes    Quit date: 04/16/2019    Years since quitting: 1.6  . Smokeless tobacco: Never Used  Substance Use Topics  . Alcohol use: Never    Family History  Adopted: Yes  Problem Relation Age of Onset  . Bipolar disorder Daughter   . Drug abuse Daughter   . Hypertension Son     Review of Systems  Constitutional: Negative.   Respiratory: Negative.   Cardiovascular: Negative.   Gastrointestinal: Negative.   Musculoskeletal: Positive for joint pain.  Skin: Negative.   Neurological: Negative.     Objective  Constitutional:      General: Not in acute distress.   Pulmonary:     Effort: Pulmonary effort is normal. No respiratory distress.  Neurological:     Mental Status: Alert and oriented to person, place, and time.  Psychiatric:        Mood and Affect: Mood normal.        Behavior: Behavior normal.     ASSESSMENT and PLAN  Problem List Items Addressed This Visit   None   Visit Diagnoses    Arthralgia of both hands    -  Primary   Lyme disease       Relevant Medications    doxycycline (VIBRAMYCIN) 100 MG capsule   predniSONE (DELTASONE) 20 MG tablet      Plan . R/se/b of medications prescribed discussed . RTC/ED precautions discussed  Return in about 2 weeks (around 01/07/2021).    The above assessment and management plan was discussed with the patient. The patient verbalized understanding of and has agreed to the management plan. Patient is aware to call the clinic if symptoms persist or worsen. Patient is aware when to return to the clinic for a follow-up visit. Patient educated on when it is appropriate to go to the emergency department.     Huston Foley Mazey Mantell, FNP-BC [facility]

## 2021-01-05 ENCOUNTER — Telehealth: Payer: Self-pay | Admitting: Family Medicine

## 2021-01-05 NOTE — Telephone Encounter (Signed)
Copied from CRM [id number]. Topic: Medicare AWV >> Jan 05, 2021  3:10 PM Claudette Laws R wrote: Reason for CRM:  Left message for patient to call back and schedule Medicare Annual Wellness Visit (AWV) in office.   If unable to come into the office for AWV,  please offer to do virtually or by telephone.  Last AWV:   09/25/2019  Please schedule at anytime with [facility] Health Advisor.  40 minute appointment  Any questions, please contact me at [phone]

## 2021-01-26 ENCOUNTER — Encounter: Payer: Self-pay | Admitting: Family Medicine

## 2021-02-03 ENCOUNTER — Telehealth: Payer: Self-pay | Admitting: Family Medicine

## 2021-02-03 NOTE — Telephone Encounter (Signed)
Copied from CRM [id number]. Topic: Medicare AWV >> Feb 03, 2021 11:07 AM Claudette Laws R wrote: Reason for CRM No answer unable to leave a message for patient to call back and schedule Medicare Annual Wellness Visit (AWV) in office.   If unable to come into the office for AWV,  please offer to do virtually or by telephone.  Last AWV: 09/25/2019  Please schedule at anytime with [facility] Health Advisor.  40 minute appointment  Any questions, please contact me at [phone]

## 2021-02-19 ENCOUNTER — Other Ambulatory Visit: Payer: Self-pay | Admitting: Family Medicine

## 2021-02-19 DIAGNOSIS — G894 Chronic pain syndrome: Secondary | ICD-10-CM

## 2021-02-19 DIAGNOSIS — M797 Fibromyalgia: Secondary | ICD-10-CM

## 2021-02-19 DIAGNOSIS — M419 Scoliosis, unspecified: Secondary | ICD-10-CM

## 2021-02-19 DIAGNOSIS — F419 Anxiety disorder, unspecified: Secondary | ICD-10-CM

## 2021-02-21 NOTE — Telephone Encounter (Signed)
Pt needs a med refill appt. Called to schedule appt but no vm come on and the other number said that no longer in service.

## 2021-02-21 NOTE — Telephone Encounter (Signed)
P needs to schedule an appt

## 2021-03-06 ENCOUNTER — Other Ambulatory Visit: Payer: Self-pay | Admitting: Family Medicine

## 2021-03-06 DIAGNOSIS — M797 Fibromyalgia: Secondary | ICD-10-CM

## 2021-03-06 DIAGNOSIS — F419 Anxiety disorder, unspecified: Secondary | ICD-10-CM

## 2021-03-06 DIAGNOSIS — G894 Chronic pain syndrome: Secondary | ICD-10-CM

## 2021-03-06 DIAGNOSIS — M419 Scoliosis, unspecified: Secondary | ICD-10-CM

## 2021-03-30 ENCOUNTER — Telehealth: Payer: Self-pay | Admitting: Family Medicine

## 2021-03-30 NOTE — Telephone Encounter (Signed)
Copied from CRM [id number]. Topic: Medicare AWV >> Mar 30, 2021 11:06 AM Claudette Laws R wrote: Reason for CRM:  No answer unable to leave a message for patient to call back and schedule Medicare Annual Wellness Visit (AWV) in office.   If unable to come into the office for AWV,  please offer to do virtually or by telephone.  Last AWV:  09/25/2019  Please schedule at anytime with [facility] Health Advisor.  40 minute appointment  Any questions, please contact me at [phone]

## 2021-04-22 ENCOUNTER — Other Ambulatory Visit: Payer: Self-pay | Admitting: Family Medicine

## 2021-04-22 DIAGNOSIS — E119 Type 2 diabetes mellitus without complications: Secondary | ICD-10-CM

## 2021-04-22 NOTE — Telephone Encounter (Signed)
Pt given 3 month supply of Metformin, will need a follow-up appt schedule before anymore refills will be given.

## 2021-04-22 NOTE — Telephone Encounter (Signed)
Lvm informing prescription has been sent to pharmacy and to also return call to sch appt

## 2021-04-25 ENCOUNTER — Other Ambulatory Visit: Payer: Self-pay | Admitting: Family Medicine

## 2021-04-26 ENCOUNTER — Telehealth: Payer: Self-pay | Admitting: Family Medicine

## 2021-04-26 NOTE — Telephone Encounter (Signed)
Copied from CRM [id number]. Topic: Medicare AWV >> Apr 26, 2021 11:08 AM Claudette Laws R wrote: Reason for CRM:  Left message for patient to call back and schedule Medicare Annual Wellness Visit (AWV) in office.   If unable to come into the office for AWV,  please offer to do virtually or by telephone.  Last AWV:  09/25/2019  Please schedule at anytime with [facility] Health Advisor.  40 minute appointment  Any questions, please contact me at [phone]

## 2021-05-09 ENCOUNTER — Telehealth: Payer: Self-pay | Admitting: Family Medicine

## 2021-05-09 NOTE — Telephone Encounter (Signed)
Copied from CRM [id number]. Topic: Medicare AWV >> May 09, 2021  1:09 PM Claudette Laws R wrote: Reason for CRM:  Left message for patient to call back and schedule Medicare Annual Wellness Visit (AWV) in office.   If unable to come into the office for AWV,  please offer to do virtually or by telephone.  Last AWV: 09/25/2019  Please schedule at anytime with [facility] Health Advisor.  40 minute appointment  Any questions, please contact me at [phone]

## 2021-05-23 ENCOUNTER — Telehealth: Payer: Self-pay | Admitting: Family Medicine

## 2021-05-23 NOTE — Telephone Encounter (Signed)
Copied from CRM [id number]. Topic: Medicare AWV >> May 23, 2021 10:18 AM Claudette Laws R wrote: Reason for CRM:  No answer unable to leave a  message for patient to call back and schedule Medicare Annual Wellness Visit (AWV) in office.   If unable to come into the office for AWV,  please offer to do virtually or by telephone.  Last AWV: 09/25/2019  Please schedule at anytime with [facility] Health Advisor.  40 minute appointment  Any questions, please contact me at [phone]

## 2021-06-09 ENCOUNTER — Telehealth: Payer: Self-pay | Admitting: Family Medicine

## 2021-06-09 DIAGNOSIS — M797 Fibromyalgia: Secondary | ICD-10-CM

## 2021-06-09 DIAGNOSIS — M419 Scoliosis, unspecified: Secondary | ICD-10-CM

## 2021-06-09 DIAGNOSIS — E119 Type 2 diabetes mellitus without complications: Secondary | ICD-10-CM

## 2021-06-09 DIAGNOSIS — J45909 Unspecified asthma, uncomplicated: Secondary | ICD-10-CM

## 2021-06-09 DIAGNOSIS — F419 Anxiety disorder, unspecified: Secondary | ICD-10-CM

## 2021-06-09 DIAGNOSIS — J449 Chronic obstructive pulmonary disease, unspecified: Secondary | ICD-10-CM

## 2021-06-09 DIAGNOSIS — I10 Essential (primary) hypertension: Secondary | ICD-10-CM

## 2021-06-09 DIAGNOSIS — G894 Chronic pain syndrome: Secondary | ICD-10-CM

## 2021-06-09 NOTE — Telephone Encounter (Signed)
*  Pt states she has moved and has been sending over a release forms to get her medical records sent to her new office, but pt states it has been taking forever for them to do so, and she is completely out of her medication, and really needs another refill asap.*

## 2021-06-09 NOTE — Telephone Encounter (Signed)
Medication Refill - Medication:  metFORMIN (GLUCOPHAGE) 500 MG tablet  traMADol (ULTRAM) 50 MG tablet gabapentin (NEURONTIN) 300 MG capsule  lisinopril (ZESTRIL) 20 MG tablet   baclofen (LIORESAL) 10 MG tablet  VENTOLIN HFA 108 (90 Base) MCG/ACT inhaler  Fluticasone-Salmeterol (ADVAIR) 250-50 MCG/DOSE AEPB   Has the patient contacted their pharmacy? Yes.   Contact pcp  Preferred Pharmacy (with phone number or street name):  [id number] - [place], [place] - [address]  Phone:  [phone] Fax:  [phone]  Has the patient been seen for an appointment in the last year OR does the patient have an upcoming appointment? Yes.   *Pt states she has moved and has been sending over a release forms to get her medical records sent to her new office, but pt states it has been taking forever for them to do so, and she is completely out of her medication, and really needs another refill asap.*   Agent: Please be advised that RX refills may take up to 3 business days. We ask that you follow-up with your pharmacy.

## 2021-06-10 MED ORDER — LISINOPRIL 20 MG PO TABS: 20.0000 mg | ORAL_TABLET | Freq: Every day | ORAL | 0 refills | Status: DC

## 2021-06-10 MED ORDER — METFORMIN HCL 500 MG PO TABS: ORAL_TABLET | ORAL | 0 refills | Status: AC

## 2021-06-10 MED ORDER — ALBUTEROL SULFATE HFA 108 (90 BASE) MCG/ACT IN AERS: 2.0000 | INHALATION_SPRAY | RESPIRATORY_TRACT | 0 refills | Status: AC | PRN

## 2021-06-14 NOTE — Telephone Encounter (Signed)
Patient called back about status of medication. Was routed to wrong office at first.. Please call back

## 2021-06-14 NOTE — Telephone Encounter (Signed)
Patient called to discuss the status of medication. She says she has 3 medications waiting at the pharmacy, but the other medications she requested are not there. I advised Dr. Carlynn Purl did not refill. She says that she has been without her medication for several months and doesn't understand why her records are not at her new doctor's office, says they will not see her until the records are there. She says if she needs to be seen by Dr. Carlynn Purl, she will do that. She is out of town, so a virtual visit is what is recommended. MyChart Video visit scheduled for tomorrow at 1120 with Dr. Carlynn Purl. Patient would like the refill on Cymbalta and Advair.

## 2021-06-15 ENCOUNTER — Encounter: Payer: Self-pay | Admitting: Family Medicine

## 2021-06-15 ENCOUNTER — Telehealth (INDEPENDENT_AMBULATORY_CARE_PROVIDER_SITE_OTHER): Payer: 59 | Admitting: Family Medicine

## 2021-06-15 DIAGNOSIS — K219 Gastro-esophageal reflux disease without esophagitis: Secondary | ICD-10-CM | POA: Diagnosis not present

## 2021-06-15 DIAGNOSIS — F419 Anxiety disorder, unspecified: Secondary | ICD-10-CM | POA: Diagnosis not present

## 2021-06-15 DIAGNOSIS — M797 Fibromyalgia: Secondary | ICD-10-CM

## 2021-06-15 DIAGNOSIS — E119 Type 2 diabetes mellitus without complications: Secondary | ICD-10-CM

## 2021-06-15 DIAGNOSIS — E785 Hyperlipidemia, unspecified: Secondary | ICD-10-CM

## 2021-06-15 DIAGNOSIS — I10 Essential (primary) hypertension: Secondary | ICD-10-CM

## 2021-06-15 DIAGNOSIS — E1169 Type 2 diabetes mellitus with other specified complication: Secondary | ICD-10-CM

## 2021-06-15 MED ORDER — DULOXETINE HCL 60 MG PO CPEP: 60.0000 mg | ORAL_CAPSULE | Freq: Every day | ORAL | 0 refills | Status: AC

## 2021-06-15 MED ORDER — GABAPENTIN 300 MG PO CAPS: ORAL_CAPSULE | ORAL | 0 refills | Status: AC

## 2021-06-15 MED ORDER — PANTOPRAZOLE SODIUM 40 MG PO TBEC: 40.0000 mg | DELAYED_RELEASE_TABLET | Freq: Every day | ORAL | 0 refills | Status: AC

## 2021-06-15 MED ORDER — ROSUVASTATIN CALCIUM 10 MG PO TABS: 10.0000 mg | ORAL_TABLET | Freq: Every day | ORAL | 0 refills | Status: AC

## 2021-06-15 NOTE — Progress Notes (Signed)
Name: Angela Herman   MRN: [id number]    DOB: 04-15-1966   Date:06/15/2021       Progress Note  Subjective  Chief Complaint  Medication Refill  I connected with  Angela Herman  on 06/15/21 at 11:20 AM EDT by a phone telemedicine application and verified that I am speaking with the correct person using two identifiers.  I discussed the limitations of evaluation and management by telemedicine and the availability of in person appointments. The patient expressed understanding and agreed to proceed with the virtual visit  Staff also discussed with the patient that there may be a patient responsible charge related to this service. Patient Location: at work  Provider Location: [facility] Additional Individuals present: at home   HPI  DMII: she was seen in our office Feb 2022 , states shortly after she moved to a different county and is trying to establish with a provider in that area, however they have not received our medical records and she needs refills of her medications. She states glucose has been going up to 200 lately, denies polyphagia, polydipsia or polyuria. She states not sure if currently taking Crestor,we will send a new prescription today. Explained importance of being seen quickly since she needs A1C level done   FMS: she has a long history of FMS, aching all over, symptoms are worse during winter months. She states pain today is aching like and 7/10 , she is out of Tramadol, last rx was written for April, she states out of medication for over 30 days but pain is increasing, explained that since she has been out of Tramadol for about one month I will not fill this medication, we can adjust dose of Duloxetine to improve her pain, and also explained she needs to titrate dose of gabapentin slowly, by one pill every 5 days to get to previous daily dose she was taking prior to running out of medications.   Dyslipidemia; she is not sure if taking Crestor, rx was sent to [place] last  year and we will send new rx to her new pharmacy today. Explained importance of regular follow up and labs.  HTN: she has not been checking bp at home, she denies chest pain or palpitation  Patient Active Problem List   Diagnosis Date Noted   Dyslipidemia associated with type 2 diabetes mellitus ([place]) 01/27/2020   Chronic pain syndrome 01/15/2020   Dyslipidemia 06/13/2019   Varicose veins of leg with pain, right 06/13/2019   Bilateral lower extremity edema 06/13/2019   Scoliosis of thoracic spine 06/13/2019   Fibromyalgia 06/13/2019   Asthma 06/13/2019   Chronic obstructive pulmonary disease ([place]) 06/13/2019   Prediabetes 06/13/2019   Essential hypertension 06/13/2019   Gastroesophageal reflux disease 06/13/2019   Anxiety disorder 06/13/2019    Past Surgical History:  Procedure Laterality Date   ABDOMINAL HYSTERECTOMY  2009   CATARACT EXTRACTION     CHOLECYSTECTOMY  2012   CORNEA LACERATION REPAIR     right retinal detachment Right    TUBAL LIGATION  1988    Family History  Adopted: Yes  Problem Relation Age of Onset   Bipolar disorder Daughter    Drug abuse Daughter    Hypertension Son      Current Outpatient Medications:    albuterol (VENTOLIN HFA) 108 (90 Base) MCG/ACT inhaler, Inhale 2 puffs into the lungs every 4 (four) hours as needed for wheezing or shortness of breath., Disp: 1 each, Rfl: 0   Alcohol Swabs (ALCOHOL PADS)  70 % PADS, , Disp: , Rfl:    Ascorbic Acid (VITAMIN C PO), Take 1 tablet by mouth daily., Disp: , Rfl:    baclofen (LIORESAL) 10 MG tablet, TAKE 1 TABLET BY MOUTH THREE TIMES A DAY, Disp: 270 tablet, Rfl: 3   Blood Glucose Calibration (OT ULTRA/FASTTK CNTRL SOLN) SOLN, See admin instructions., Disp: , Rfl:    Blood Glucose Monitoring Suppl (ACCU-CHEK GUIDE) w/Device KIT, 1 kit by Does not apply route daily. DX:E11.9,  LON:99 Check BG qd (Patient taking differently: 1 kit by Does not apply route daily. DX:E11.9,  LON:99 Check BG qd), Disp: 1 kit,  Rfl: 0   Blood Glucose Monitoring Suppl (ONETOUCH VERIO REFLECT) w/Device KIT, Check blood sugar once daily in the morning prior to breakfast, Disp: 1 kit, Rfl: 0   busPIRone (BUSPAR) 15 MG tablet, Take 0.5-1 tablets (7.5-15 mg total) by mouth 3 (three) times daily as needed., Disp: 270 tablet, Rfl: 1   Cholecalciferol (D3 PO), Take 1 capsule by mouth daily., Disp: , Rfl:    Cyanocobalamin (B-12 PO), Take 1 tablet by mouth daily., Disp: , Rfl:    diclofenac Sodium (VOLTAREN) 1 % GEL, APPLY 4 G TOPICALLY 2 (TWO) TIMES DAILY. (Patient taking differently: Apply 4 g topically 4 (four) times daily as needed (pain).), Disp: 100 g, Rfl: 11   diphenhydramine-acetaminophen (TYLENOL PM) 25-500 MG TABS tablet, Take 2 tablets by mouth at bedtime as needed (headache/pain/sleep)., Disp: , Rfl:    Easy Comfort Lancets MISC, daily., Disp: , Rfl:    Fluticasone-Salmeterol (ADVAIR) 250-50 MCG/DOSE AEPB, Inhale 1 puff into the lungs 2 (two) times daily., Disp: 60 each, Rfl: 11   gabapentin (NEURONTIN) 300 MG capsule, TAKE 1 CAPSULE BY MOUTH EVERY MORNING,1 CAPSULE AT NOON AND 3 CAPSULES AT BEDTIME (Patient taking differently: Take 300-900 mg by mouth See admin instructions. TAKE 363m BY MOUTH EVERY MORNING,3071mAT NOON AND 90073mT BEDTIME), Disp: 450 capsule, Rfl: 3   ipratropium-albuterol (DUONEB) 0.5-2.5 (3) MG/3ML SOLN, Take 3 mLs by nebulization every 6 (six) hours as needed (for worse cough, SOB and wheeze for COPD and asthma exacerbation)., Disp: 180 mL, Rfl: 1   Lancet Devices (EASY MINI EJECT LANCING DEVICE) MISC, daily., Disp: , Rfl:    lisinopril (ZESTRIL) 20 MG tablet, Take 1 tablet (20 mg total) by mouth daily., Disp: 30 tablet, Rfl: 0   metFORMIN (GLUCOPHAGE) 500 MG tablet, TAKE 2 TABLETS BY MOUTH TWICE A DAY WITH A MEAL, Disp: 120 tablet, Rfl: 0   omega-3 acid ethyl esters (LOVAZA) 1 g capsule, Take 1 capsule by mouth 2 (two) times daily., Disp: , Rfl:    ONETOUCH VERIO test strip, CHECK BG DAILY DX  E11.9, LON:99, Disp: 100 strip, Rfl: 9   pantoprazole (PROTONIX) 40 MG tablet, TAKE 1 TABLET BY MOUTH EVERY DAY, Disp: 90 tablet, Rfl: 1   rosuvastatin (CRESTOR) 10 MG tablet, Take 1 tablet (10 mg total) by mouth at bedtime., Disp: 90 tablet, Rfl: 3   traMADol (ULTRAM) 50 MG tablet, Take 1 tablet (50 mg total) by mouth every 8 (eight) hours as needed for severe pain., Disp: 90 tablet, Rfl: 0  Allergies  Allergen Reactions   Aspirin    Cherry    Codeine     I personally reviewed active problem list, medication list, allergies, family history, social history, health maintenance with the patient/caregiver today.   ROS  Ten systems reviewed and is negative except as mentioned in HPI   Objective  Virtual encounter, vitals  not obtained.  There is no height or weight on file to calculate BMI.  Physical Exam  Awake, alert and oriented   PHQ2/9: Depression screen [facility] 2/9 06/15/2021 12/24/2020 10/21/2020 07/21/2020 04/20/2020  Decreased Interest 0 0 1 1 0  Down, Depressed, Hopeless 0 0 1 2 0  PHQ - 2 Score 0 0 2 3 0  Altered sleeping 0 0 2 2 0  Tired, decreased energy 0 0 2 2 0  Change in appetite 0 0 1 2 0  Feeling bad or failure about yourself  0 0 1 2 0  Trouble concentrating 0 0 1 2 0  Moving slowly or fidgety/restless 0 0 0 0 0  Suicidal thoughts 0 0 0 0 0  PHQ-9 Score 0 0 9 13 0  Difficult doing work/chores Not difficult at all Not difficult at all Somewhat difficult Somewhat difficult Not difficult at all  Some recent data might be hidden   PHQ-2/9 Result is negative.    Fall Risk: Fall Risk  06/15/2021 12/24/2020 10/21/2020 07/21/2020 04/20/2020  Falls in the past year? 0 0 0 0 0  Number falls in past yr: 0 0 0 0 0  Injury with Fall? 0 0 0 0 0  Risk for fall due to : No Fall Risks - - - -  Follow up Falls prevention discussed Falls evaluation completed - Falls evaluation completed Falls evaluation completed     Assessment & Plan   1. Anxiety disorder, unspecified  type   We will adjust dose to 60 mg of Duloxetine  2. Gastroesophageal reflux disease, unspecified whether esophagitis present  - pantoprazole (PROTONIX) 40 MG tablet; Take 1 tablet (40 mg total) by mouth daily.  Dispense: 90 tablet; Refill: 0  3. Essential hypertension   4. DM type 2 with diabetic dyslipidemia (HCC)  - omega-3 acid ethyl esters (LOVAZA) 1 g capsule; Take 1 capsule by mouth 2 (two) times daily.  5. Fibromyalgia  - gabapentin (NEURONTIN) 300 MG capsule; TAKE 1 CAPSULE BY MOUTH EVERY MORNING,1 CAPSULE AT NOON AND 3 CAPSULES AT BEDTIME  Dispense: 150 capsule; Refill: 0 - DULoxetine (CYMBALTA) 60 MG capsule; Take 1 capsule (60 mg total) by mouth daily.  Dispense: 30 capsule; Refill: 0   I discussed the assessment and treatment plan with the patient. The patient was provided an opportunity to ask questions and all were answered. The patient agreed with the plan and demonstrated an understanding of the instructions.  The patient was advised to call back or seek an in-person evaluation if the symptoms worsen or if the condition fails to improve as anticipated.  I provided 25  minutes of non-face-to-face time during this encounter.

## 2021-07-10 ENCOUNTER — Other Ambulatory Visit: Payer: Self-pay | Admitting: Family Medicine

## 2021-07-10 DIAGNOSIS — E119 Type 2 diabetes mellitus without complications: Secondary | ICD-10-CM

## 2021-07-10 DIAGNOSIS — F419 Anxiety disorder, unspecified: Secondary | ICD-10-CM

## 2021-07-10 DIAGNOSIS — I10 Essential (primary) hypertension: Secondary | ICD-10-CM

## 2021-07-10 DIAGNOSIS — M797 Fibromyalgia: Secondary | ICD-10-CM

## 2021-07-11 NOTE — Telephone Encounter (Signed)
Requested Prescriptions  Pending Prescriptions Disp Refills  . lisinopril (ZESTRIL) 20 MG tablet [Pharmacy Med Name: LISINOPRIL 20 MG TABLET] 90 tablet 0    Sig: TAKE 1 TABLET BY MOUTH EVERY DAY     Cardiovascular:  ACE Inhibitors Failed - 07/10/2021  2:31 PM      Failed - Cr in normal range and within 180 days    Creat  Date Value Ref Range Status  07/21/2020 0.58 0.50 - 1.05 mg/dL Final    Comment:    For patients >56 years of age, the reference limit for Creatinine is approximately 13% higher for people identified as African-American. .    Creatinine, Ser  Date Value Ref Range Status  12/19/2020 0.57 0.44 - 1.00 mg/dL Final   Creatinine, Urine  Date Value Ref Range Status  01/15/2020 109 20 - 275 mg/dL Final         Failed - K in normal range and within 180 days    Potassium  Date Value Ref Range Status  12/19/2020 4.1 3.5 - 5.1 mmol/L Final         Failed - Last BP in normal range    BP Readings from Last 1 Encounters:  12/19/20 (!) 125/98         Passed - Patient is not pregnant      Passed - Valid encounter within last 6 months    Recent Outpatient Visits          3 weeks ago DM type 2 with diabetic dyslipidemia (HCC)   [facility] Alba Cory, MD   6 months ago Arthralgia of both hands   [facility] Just, Azalee Course, FNP   8 months ago Type 2 diabetes mellitus without complication, without long-term current use of insulin [facility])   [facility] [place], Sheliah Mends, PA-C   11 months ago Type 2 diabetes mellitus without complication, without long-term current use of insulin [facility])   [facility] [place], Sheliah Mends, PA-C   1 year ago Type 2 diabetes mellitus without complication, without long-term current use of insulin [facility])   [facility] Danelle Berry, [place]

## 2021-10-05 ENCOUNTER — Other Ambulatory Visit: Payer: Self-pay

## 2021-10-05 DIAGNOSIS — M419 Scoliosis, unspecified: Secondary | ICD-10-CM

## 2021-10-05 DIAGNOSIS — E785 Hyperlipidemia, unspecified: Secondary | ICD-10-CM

## 2021-10-05 DIAGNOSIS — E1169 Type 2 diabetes mellitus with other specified complication: Secondary | ICD-10-CM

## 2021-10-05 DIAGNOSIS — M797 Fibromyalgia: Secondary | ICD-10-CM

## 2021-10-05 MED ORDER — OMEGA-3-ACID ETHYL ESTERS 1 G PO CAPS: 1.0000 | ORAL_CAPSULE | Freq: Two times a day (BID) | ORAL | 0 refills | Status: AC

## 2021-10-05 MED ORDER — DICLOFENAC SODIUM 1 % EX GEL: CUTANEOUS | 11 refills | Status: DC

## 2021-10-05 MED ORDER — DICLOFENAC SODIUM 1 % EX GEL: CUTANEOUS | 11 refills | Status: AC

## 2021-10-05 NOTE — Addendum Note (Signed)
Addended by: Talitha Givens on: 10/05/2021 01:04 PM   Modules accepted: Orders

## 2021-11-19 ENCOUNTER — Other Ambulatory Visit: Payer: Self-pay | Admitting: Physician Assistant

## 2021-11-19 DIAGNOSIS — E1169 Type 2 diabetes mellitus with other specified complication: Secondary | ICD-10-CM

## 2021-11-21 NOTE — Telephone Encounter (Signed)
Requested medications are due for refill today.  yes ? ?Requested medications are on the active medications list.  yes ? ?Last refill. 03/08/2021 #90 1 refill ? ?Future visit scheduled.   No  ? ?Notes to clinic.  No pcp listed. Labs are expired. ? ? ? ?Requested Prescriptions  ?Pending Prescriptions Disp Refills  ? omega-3 acid ethyl esters (LOVAZA) 1 g capsule [Pharmacy Med Name: omega-3 acid ethyl esters 1 gram capsule] 180 capsule 2  ?  Sig: Take 1 capsule (1 g total) by mouth 2 (two) times daily.  ?  ? Endocrinology:  Nutritional Agents - omega-3 acid ethyl esters Failed - 11/19/2021  8:02 AM  ?  ?  Failed - Lipid Panel in normal range within the last 12 months  ?  Cholesterol  ?Date Value Ref Range Status  ?04/20/2020 129 <200 mg/dL Final  ? ?LDL Cholesterol (Calc)  ?Date Value Ref Range Status  ?04/20/2020 64 mg/dL (calc) Final  ?  Comment:  ?  Reference range: <100 ?Marland Kitchen ?Desirable range <100 mg/dL for primary prevention;   ?<70 mg/dL for patients with CHD or diabetic patients  ?with > or = 2 CHD risk factors. ?. ?LDL-C is now calculated using the Martin-Hopkins  ?calculation, which is a validated novel method providing  ?better accuracy than the Friedewald equation in the  ?estimation of LDL-C.  ?Horald Pollen et al. Lenox Ahr. 8416;606(30): 2061-2068  ?(http://education.[facility].com/faq/FAQ164) ?  ? ?HDL  ?Date Value Ref Range Status  ?04/20/2020 42 (L) > OR = 50 mg/dL Final  ? ?Triglycerides  ?Date Value Ref Range Status  ?04/20/2020 157 (H) <150 mg/dL Final  ? ?  ?  ?  Passed - Valid encounter within last 12 months  ?  Recent Outpatient Visits   ? ?      ? 5 months ago DM type 2 with diabetic dyslipidemia (HCC)  ? [facility] [place], Danna Hefty, MD  ? 11 months ago Arthralgia of both hands  ? [facility] Just, Azalee Course, FNP  ? 1 year ago Type 2 diabetes mellitus without complication, without long-term current use of insulin (HCC)  ? [facility] Danelle Berry, PA-C  ? 1 year ago Type 2 diabetes mellitus without complication, without long-term current use of insulin (HCC)  ? [facility] Danelle Berry, PA-C  ? 1 year ago Type 2 diabetes mellitus without complication, without long-term current use of insulin (HCC)  ? [facility] Danelle Berry, [place]  ? ?  ?  ? ?  ?  ?  ?  ?

## 2022-08-07 IMAGING — DX DG SHOULDER 2+V*L*
3 series · 3 of 3 positions shown · non-contrast
Comparison: Chest x-ray 01/15/2020

CLINICAL DATA: Left shoulder pain.  Gout.

EXAM:
LEFT SHOULDER - 2+ VIEW

[shoulder grashey]
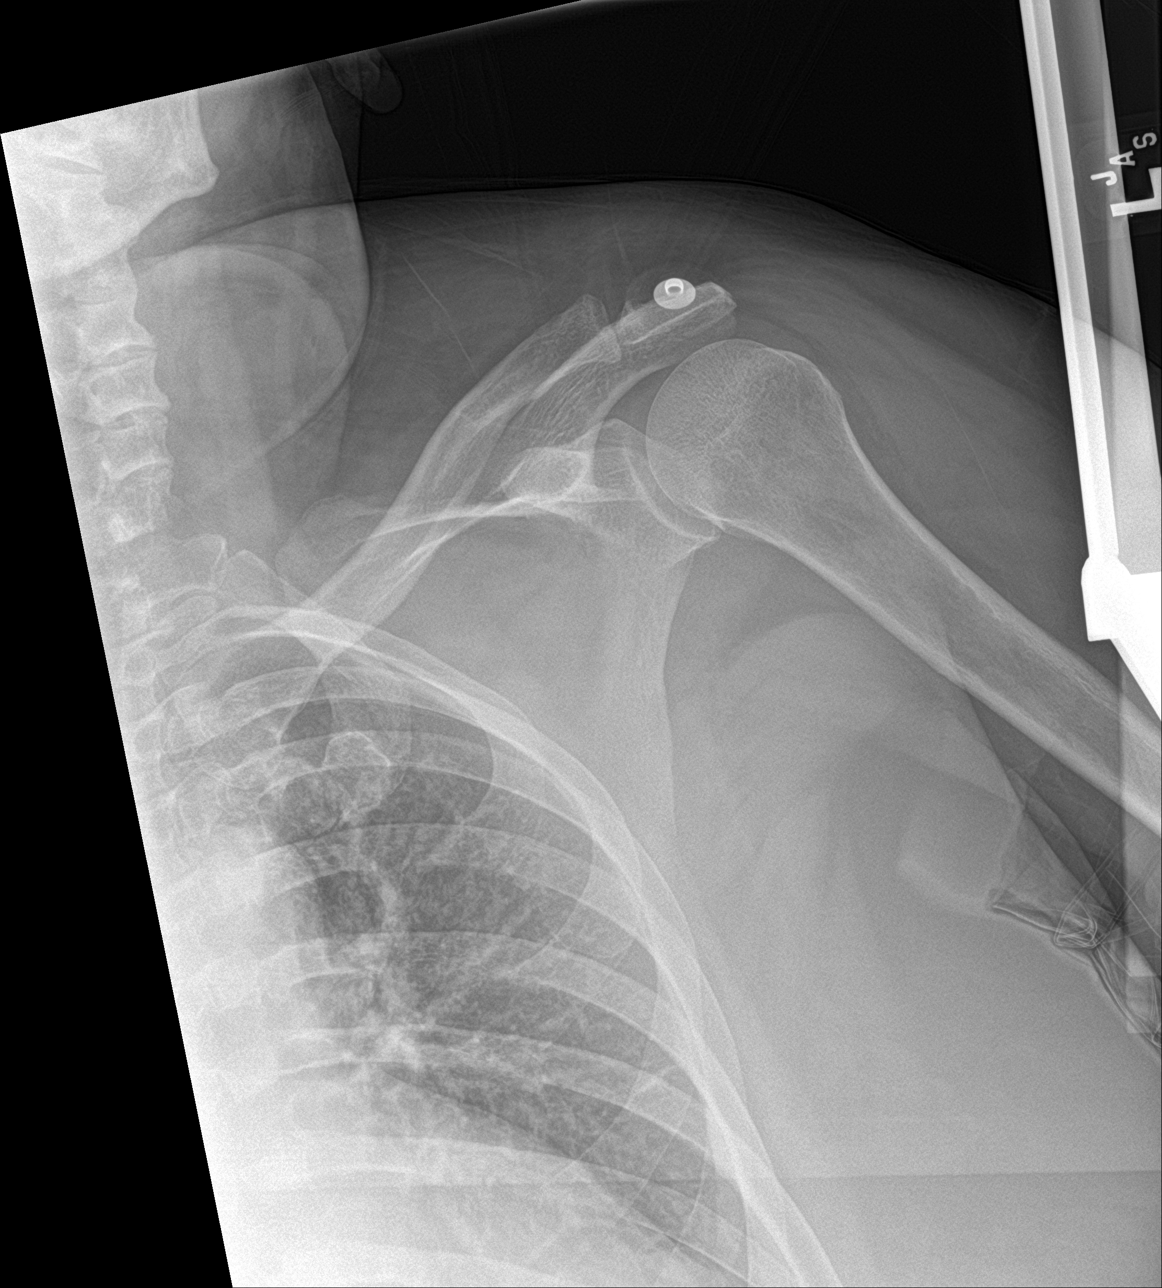

[shoulder y view (1 of 2)]
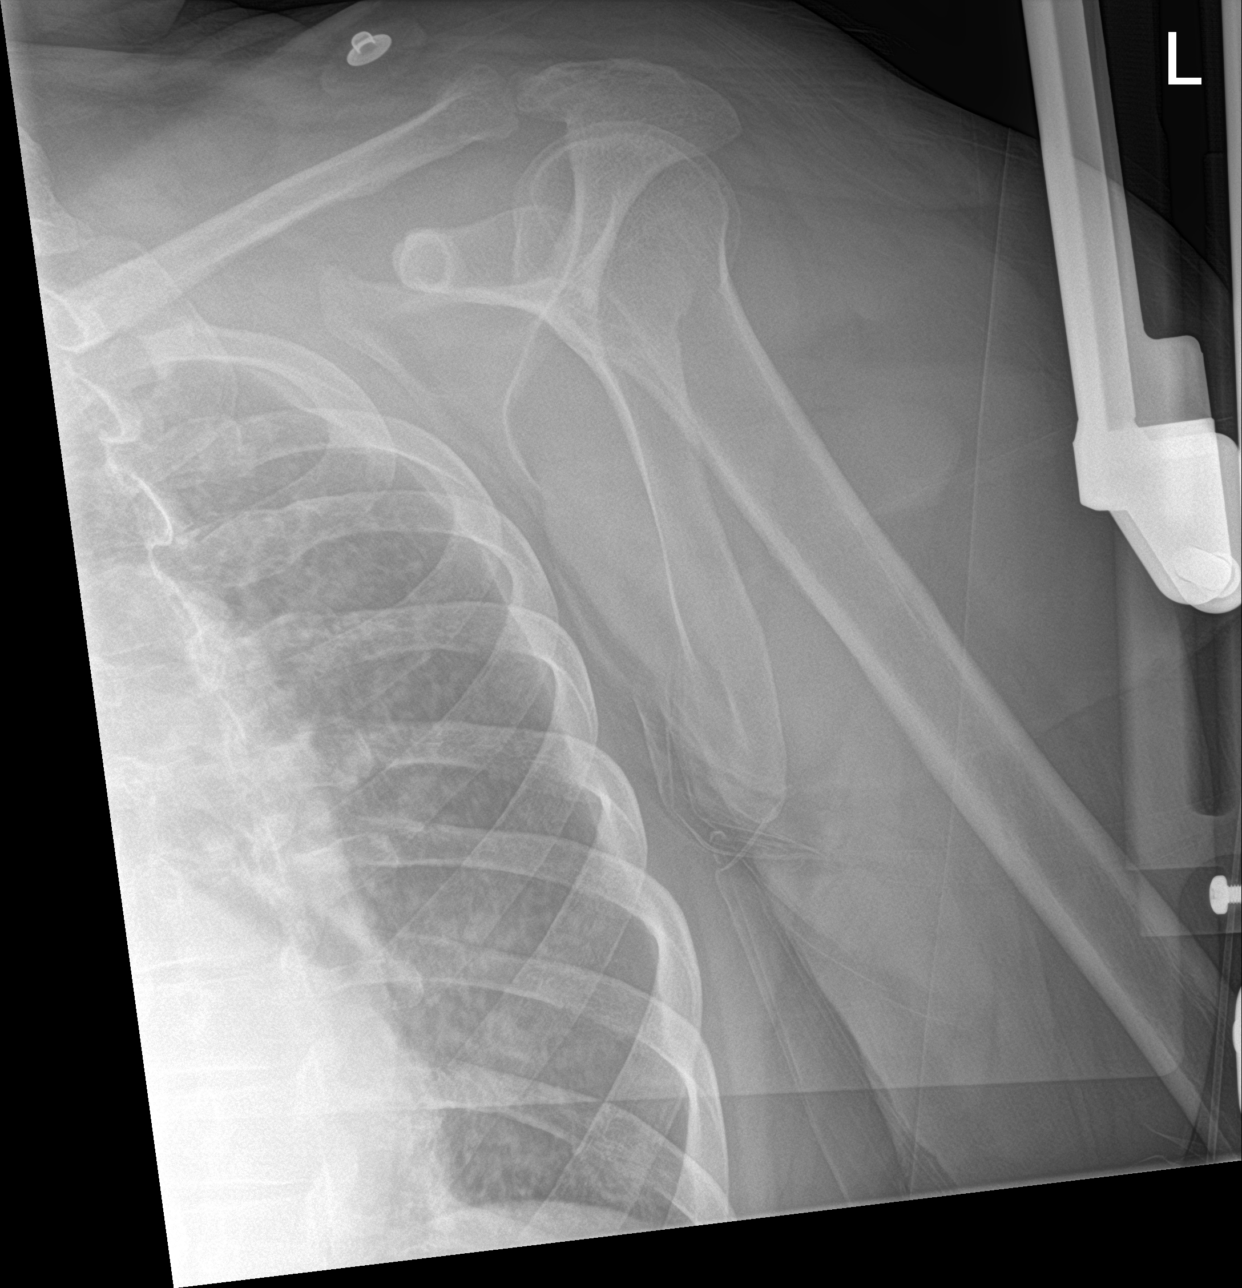

[shoulder y view (2 of 2)]
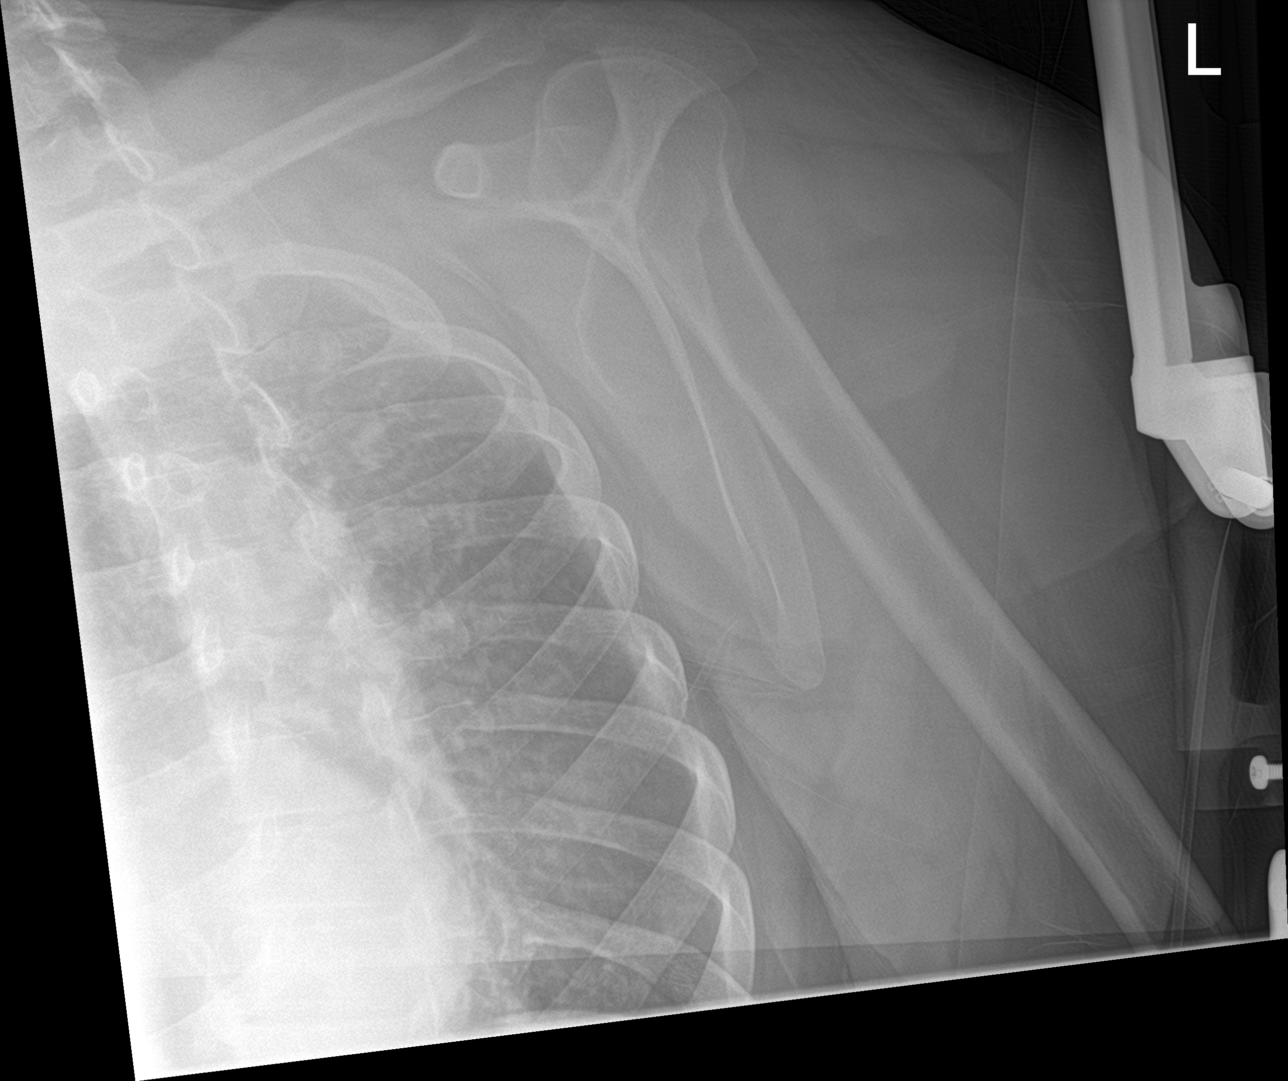

[3 of 3 positions shown; findings below may reference images not displayed]

FINDINGS: There is no evidence of fracture or dislocation. Mild arthropathy of
the left AC joint. Glenohumeral joint appears within normal limits.
No erosion is seen. Soft tissues are unremarkable.
IMPRESSION: Mild arthropathy of the left AC joint.  No acute findings.

## 2024-06-27 NOTE — Progress Notes (Signed)
 Angela Herman                                          MRN: [id number]   06/27/2024   The [facility] Quality Team Specialist reviewed this patient medical record for the purposes of chart review for care gap closure. The following were reviewed: chart review for care gap closure-kidney health evaluation for diabetes:eGFR  and uACR.    [facility] Quality Team

## 2024-07-17 NOTE — Progress Notes (Signed)
 Angela Herman                                          MRN: [id number]   07/17/2024   The [facility] Quality Team Specialist reviewed this patient medical record for the purposes of chart review for care gap closure. The following were reviewed: erroneous encounter.    [facility] Quality Team
